# Patient Record
Sex: Female | Born: 1940 | Race: White | Hispanic: No | Smoking: Former smoker
Health system: Southern US, Community
[De-identification: ages and names within clinical notes are randomized; demographics above are authoritative.]

---

## 2004-01-28 ENCOUNTER — Ambulatory Visit (HOSPITAL_COMMUNITY): Admission: RE | Admit: 2004-01-28 | Discharge: 2004-01-28 | Payer: Self-pay | Admitting: Plastic Surgery

## 2007-03-13 ENCOUNTER — Encounter: Admission: RE | Admit: 2007-03-13 | Discharge: 2007-03-13 | Payer: Self-pay | Admitting: Internal Medicine

## 2015-04-28 DIAGNOSIS — G4700 Insomnia, unspecified: Secondary | ICD-10-CM | POA: Insufficient documentation

## 2015-04-28 DIAGNOSIS — F981 Encopresis not due to a substance or known physiological condition: Secondary | ICD-10-CM | POA: Insufficient documentation

## 2015-04-28 DIAGNOSIS — K589 Irritable bowel syndrome without diarrhea: Secondary | ICD-10-CM | POA: Insufficient documentation

## 2015-12-18 DIAGNOSIS — F3342 Major depressive disorder, recurrent, in full remission: Secondary | ICD-10-CM | POA: Insufficient documentation

## 2016-02-12 DIAGNOSIS — H524 Presbyopia: Secondary | ICD-10-CM | POA: Insufficient documentation

## 2016-02-12 DIAGNOSIS — H2510 Age-related nuclear cataract, unspecified eye: Secondary | ICD-10-CM | POA: Insufficient documentation

## 2017-03-23 DIAGNOSIS — R159 Full incontinence of feces: Secondary | ICD-10-CM | POA: Insufficient documentation

## 2017-03-23 DIAGNOSIS — R634 Abnormal weight loss: Secondary | ICD-10-CM | POA: Insufficient documentation

## 2017-03-23 DIAGNOSIS — Z860100 Personal history of colon polyps, unspecified: Secondary | ICD-10-CM | POA: Insufficient documentation

## 2017-03-23 DIAGNOSIS — K529 Noninfective gastroenteritis and colitis, unspecified: Secondary | ICD-10-CM | POA: Insufficient documentation

## 2017-03-23 DIAGNOSIS — R630 Anorexia: Secondary | ICD-10-CM | POA: Insufficient documentation

## 2017-11-07 DIAGNOSIS — H524 Presbyopia: Secondary | ICD-10-CM | POA: Insufficient documentation

## 2017-11-07 DIAGNOSIS — H52203 Unspecified astigmatism, bilateral: Secondary | ICD-10-CM

## 2017-11-07 DIAGNOSIS — H43393 Other vitreous opacities, bilateral: Secondary | ICD-10-CM | POA: Insufficient documentation

## 2017-11-07 DIAGNOSIS — H5203 Hypermetropia, bilateral: Secondary | ICD-10-CM | POA: Insufficient documentation

## 2017-11-07 DIAGNOSIS — H40003 Preglaucoma, unspecified, bilateral: Secondary | ICD-10-CM | POA: Insufficient documentation

## 2017-12-27 DIAGNOSIS — F419 Anxiety disorder, unspecified: Secondary | ICD-10-CM | POA: Insufficient documentation

## 2018-04-25 ENCOUNTER — Telehealth (INDEPENDENT_AMBULATORY_CARE_PROVIDER_SITE_OTHER): Payer: Self-pay | Admitting: Physical Medicine and Rehabilitation

## 2018-04-25 ENCOUNTER — Ambulatory Visit (INDEPENDENT_AMBULATORY_CARE_PROVIDER_SITE_OTHER): Payer: Medicare HMO | Admitting: Physical Medicine and Rehabilitation

## 2018-04-25 ENCOUNTER — Encounter (INDEPENDENT_AMBULATORY_CARE_PROVIDER_SITE_OTHER): Payer: Self-pay | Admitting: Physical Medicine and Rehabilitation

## 2018-04-25 VITALS — BP 154/89 | HR 71

## 2018-04-25 DIAGNOSIS — M5116 Intervertebral disc disorders with radiculopathy, lumbar region: Secondary | ICD-10-CM | POA: Diagnosis not present

## 2018-04-25 DIAGNOSIS — M5441 Lumbago with sciatica, right side: Secondary | ICD-10-CM | POA: Diagnosis not present

## 2018-04-25 DIAGNOSIS — G8929 Other chronic pain: Secondary | ICD-10-CM | POA: Diagnosis not present

## 2018-04-25 DIAGNOSIS — M5416 Radiculopathy, lumbar region: Secondary | ICD-10-CM

## 2018-04-25 MED ORDER — PREDNISONE 50 MG PO TABS: ORAL_TABLET | ORAL | 0 refills | Status: AC

## 2018-04-25 NOTE — Telephone Encounter (Signed)
Needs auth see below.

## 2018-04-25 NOTE — Telephone Encounter (Signed)
Dictation done

## 2018-04-25 NOTE — Progress Notes (Signed)
Jennifer MedicoHelen Schlee - 77 y.o. female MRN [id number]  Date of birth: 04/10/1941  Office Visit Note: Visit Date: 04/25/2018 PCP: Lynn ItoSmith, Lori, MD Referred by: No ref. provider found  Subjective: Chief Complaint  Patient presents with  . Lower Back - Other  . Right Hip - Pain   HPI: Jennifer Navarro is a 77 y.o. female who comes in today At the request of her primary care physician Dr. Katrinka BlazingSmith for evaluation and management of chronic worsening right hip and thigh pain.  She reports that this began in November after she had been playing golf sometime before that and felt a twinge in her right lower back.  She reports mainly buttock type pain but with some referral across the greater trochanter and into the groin but also into the thigh.  It never goes past the knee.  She gets an aching tooth aching type sensation but no real numbness or tingling.  She gets some relief with sitting and sleeping but must sleep on her back and her awkward position if she moves at all does seem to hurt.  Standing and walking are problematic.  She initially saw her primary care physician and nurse practitioner who thought that she was having symptoms of urinary tract infection or bladder infection which she did have.  This was treated successfully but the symptoms were still remaining.  Symptoms are bad enough that she did obtain intramuscular Kenalog injection with some help.  She has been using ibuprofen with some relief.  Muscle relaxer was utilized but did not help very much at all.  She does use lorazepam at night for sleep.  She does have some level of history of anxiety.  She has not noted any left-sided complaints.  She is had no focal weakness or bowel or bladder changes.  No red flag complaints otherwise.  She has had no fevers chills or night sweats or unexplained weight loss.  She has no history of prior lumbar injections or surgery.  CT scan of the lumbar spine was ultimately obtained and this is reviewed below and does show  right-sided disc herniation at L2-3.  ROS Otherwise per HPI.  Assessment & Plan: Visit Diagnoses:  1. Chronic right-sided low back pain with right-sided sciatica   2. Radiculopathy due to lumbar intervertebral disc disorder   3. Lumbar radiculopathy     Plan: Findings:  Chronic worsening low back and right hip and thigh pain and some anterior groin pain that is in all likelihood related to the L2-3 right disc extrusion with some impact to the lateral recess superiorly or cranially.  She has normal hip movement otherwise and I think this is really an issue of the lumbar spine.  It likely resulted when she did play golf and she felt twinge in her back at that point even though it was not severe at the time.  We talked about the natural history of disc herniations and extrusions.  This will likely get better on its own at some point is these disc do tend to diminish over time and regress.  In the meantime they are quite painful with nerve root pain.  Her symptoms are pretty consistent with L2 or L3 root pain.  I think the best approach is diagnostic note for therapeutic right L2-3 interlaminar epidural steroid injection.  Would consider L2 or L3 transforaminal approach.  She has no red flag symptoms or complaints or physical findings.  If she does not get much relief obviously could look at microdiscectomy  although she really wants to consider that at this point and this would be a referral to Dr. Otelia Sergeant in our office versus a neurosurgeon.  In terms of medication management she is doing quite well on ibuprofen but I think given the fact that would not be amenable to seeing her quickly for the injection I am not going to start prednisone 50 mg daily for 5 days.  She actually got some relief with prior general Kenalog injection intramuscularly.  In the future could look at nerve membrane stabilizing type medications depending on where she is at.  We talked about activity modification and exercises.    Meds  & Orders:  Meds ordered this encounter  Medications  . predniSONE (DELTASONE) 50 MG tablet    Sig: Take 1 tablet daily with food for 5 days until finished    Dispense:  5 tablet    Refill:  0   No orders of the defined types were placed in this encounter.   Follow-up: Return for Right L2-3 interlaminar epidural steroid injection.   Procedures: No procedures performed  No notes on file   Clinical History: CT ABDOMEN AND PELVIS WITHOUT CONTRAST  TECHNIQUE: Multidetector CT imaging of the abdomen and pelvis was performed following the standard protocol without IV contrast.  COMPARISON: Lumbar spine radiographs 03/21/2018.  FINDINGS: Lower chest: Clear lung bases. No significant pleural or pericardial effusion.  Hepatobiliary: As imaged in the noncontrast state, the liver appears unremarkable. No evidence of gallstones, gallbladder wall thickening or biliary dilatation.  Pancreas: Unremarkable. No pancreatic ductal dilatation or surrounding inflammatory changes.  Spleen: Normal in size without focal abnormality.  Adrenals/Urinary Tract: Both adrenal glands appear normal. There is a 17 mm cyst in the upper pole of the left kidney. Both kidneys otherwise appear normal, without evidence of urinary tract calculus or hydronephrosis. The bladder appears normal.  Stomach/Bowel: No evidence of bowel wall thickening, distention or surrounding inflammatory change. Diminutive appendix or appendiceal stump without surrounding inflammation. Moderate stool throughout the colon.  Vascular/Lymphatic: There are no enlarged abdominal or pelvic lymph nodes. Mild aortic and branch vessel atherosclerosis.  Reproductive: The uterus and ovaries appear normal. No adnexal mass.  Other: No evidence of abdominal wall mass or hernia. No ascites.  Musculoskeletal: No acute or significant osseous findings. Lower lumbar spondylosis with advanced disc space narrowing at L5-S1. S1 transitional.  There is a moderate-sized disc extrusion at L2-3 with cephalad extension in the right subarticular lateral recess.  IMPRESSION: 1. No evidence of urinary tract calculus or hydronephrosis. 2. Right-sided disc extrusion at L2-3 may contribute to right-sided back pain. 3. No acute abdominal findings. 4. Aortic Atherosclerosis (ICD10-I70.0).   Electronically Signed  By: Carey Bullocks M.D.  On: 04/05/2018 12:25   She reports that she has quit smoking. She has never used smokeless tobacco. No results for input(s): HGBA1C, LABURIC in the last 8760 hours.  Objective:  VS:  HT:    WT:   BMI:     BP:(!) 154/89  HR:71bpm  TEMP: ( )  RESP:  Physical Exam Vitals signs and nursing note reviewed.  Constitutional:      General: She is not in acute distress.    Appearance: Normal appearance. She is well-developed.  HENT:     Head: Normocephalic and atraumatic.     Nose: Nose normal.     Mouth/Throat:     Mouth: Mucous membranes are moist.     Pharynx: Oropharynx is clear.  Eyes:     Conjunctiva/sclera: Conjunctivae  normal.     Pupils: Pupils are equal, round, and reactive to light.  Neck:     Musculoskeletal: Normal range of motion and neck supple.  Cardiovascular:     Rate and Rhythm: Regular rhythm.  Pulmonary:     Effort: Pulmonary effort is normal. No respiratory distress.  Abdominal:     General: There is no distension.     Palpations: Abdomen is soft.     Tenderness: There is no guarding.  Musculoskeletal:     Right lower leg: No edema.     Left lower leg: No edema.     Comments: Patient ambulates without aid.  She has good strength in the lower extremities bilaterally with good hip flexion 5 out of 5 and symmetric bilaterally as well as knee flexion extension and dorsiflexion plantarflexion EHL.  She has no clonus bilaterally.  She has no pain with hip rotation internally or externally.  No pain over the PSIS or greater trochanters.  Skin:    General: Skin is warm and  dry.     Findings: No erythema or rash.  Neurological:     General: No focal deficit present.     Mental Status: She is alert and oriented to person, place, and time.     Motor: No abnormal muscle tone.     Coordination: Coordination normal.     Gait: Gait normal.  Psychiatric:        Mood and Affect: Mood normal.        Behavior: Behavior normal.        Thought Content: Thought content normal.     Ortho Exam Imaging: No results found.  Past Medical/Family/Surgical/Social History: Medications & Allergies reviewed per EMR, new medications updated. Patient Active Problem List   Diagnosis Date Noted  . Anxiety 12/27/2017  . Glaucoma suspect of both eyes 11/07/2017  . Hyperopia with astigmatism and presbyopia, bilateral 11/07/2017  . Vitreous floater, bilateral 11/07/2017  . Chronic diarrhea 03/23/2017  . History of colon polyps 03/23/2017  . Incontinence of feces 03/23/2017  . Loss of appetite 03/23/2017  . Weight loss 03/23/2017  . Presbyopia 02/12/2016  . 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 Recurrent major depressive disorder, in full remission (HCC) 12/18/2015  . 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 IBS (irritable bowel syndrome) 04/28/2015  . Insomnia 04/28/2015   History reviewed. No pertinent past medical history. History reviewed. No pertinent family history. History reviewed. No pertinent surgical history. Social History   Occupational History  . Not on file  Tobacco Use  . Smoking status: Former Games developer  . Smokeless tobacco: Never Used  Substance and Sexual Activity  . Alcohol use: Not on file  . Drug use: Not on file  . Sexual activity: Not on file

## 2018-04-25 NOTE — Progress Notes (Signed)
  Numeric Pain Rating Scale and Functional Assessment Average Pain 8 Pain Right Now 2 My pain is intermittent and sharp Pain is worse with: walking and standing Pain improves with: Sitting   In the last MONTH (on 0-10 scale) has pain interfered with the following?  1. General activity like being  able to carry out your everyday physical activities such as walking, climbing stairs, carrying groceries, or moving a chair?  Rating(10)  2. Relation with others like being able to carry out your usual social activities and roles such as  activities at home, at work and in your community. Rating(10)  3. Enjoyment of life such that you have  been bothered by emotional problems such as feeling anxious, depressed or irritable?  Rating(10)

## 2018-04-26 NOTE — Telephone Encounter (Signed)
Service [phone]:[id number] Auth Effective Date:12/18/2019Auth End Date:03/17/2020Initiated Date:12/18/2019Decision Date:12/18/2019Decision Type :InitialCase Status:Approved

## 2018-05-16 ENCOUNTER — Ambulatory Visit (INDEPENDENT_AMBULATORY_CARE_PROVIDER_SITE_OTHER): Payer: Self-pay

## 2018-05-16 ENCOUNTER — Encounter (INDEPENDENT_AMBULATORY_CARE_PROVIDER_SITE_OTHER): Payer: Self-pay | Admitting: Physical Medicine and Rehabilitation

## 2018-05-16 ENCOUNTER — Ambulatory Visit (INDEPENDENT_AMBULATORY_CARE_PROVIDER_SITE_OTHER): Payer: Medicare HMO | Admitting: Physical Medicine and Rehabilitation

## 2018-05-16 VITALS — BP 162/83 | HR 69 | Temp 97.9°F

## 2018-05-16 DIAGNOSIS — M5416 Radiculopathy, lumbar region: Secondary | ICD-10-CM | POA: Diagnosis not present

## 2018-05-16 MED ORDER — METHYLPREDNISOLONE ACETATE 80 MG/ML IJ SUSP
80.0000 mg | Freq: Once | INTRAMUSCULAR | Status: AC
  Administered 2018-05-16: 80 mg

## 2018-05-16 NOTE — Procedures (Signed)
Lumbar Epidural Steroid Injection - Interlaminar Approach with Fluoroscopic Guidance  Patient: Jennifer Navarro      Date of Birth: [birth year] MRN: [id number] PCP: Lynn Ito, MD      Visit Date: 05/16/2018   Universal Protocol:     Consent Given By: the patient  Position: PRONE  Additional Comments: Vital signs were monitored before and after the procedure. Patient was prepped and draped in the usual sterile fashion. The correct patient, procedure, and site was verified.   Injection Procedure Details:  Procedure Site One Meds Administered:  Meds ordered this encounter  Medications  . methylPREDNISolone acetate (DEPO-MEDROL) injection 80 mg     Laterality: Right  Location/Site:  L2-L3  Needle size: 20 G  Needle type: Tuohy  Needle Placement: Paramedian epidural  Findings:   -Comments: Excellent flow of contrast into the epidural space.  Procedure Details: Using a paramedian approach from the side mentioned above, the region overlying the inferior lamina was localized under fluoroscopic visualization and the soft tissues overlying this structure were infiltrated with 4 ml. of 1% Lidocaine without Epinephrine. The Tuohy needle was inserted into the epidural space using a paramedian approach.   The epidural space was localized using loss of resistance along with lateral and bi-planar fluoroscopic views.  After negative aspirate for air, blood, and CSF, a 2 ml. volume of Isovue-250 was injected into the epidural space and the flow of contrast was observed. Radiographs were obtained for documentation purposes.    The injectate was administered into the level noted above.   Additional Comments:  The patient tolerated the procedure well Dressing: Band-Aid    Post-procedure details: Patient was observed during the procedure. Post-procedure instructions were reviewed.  Patient left the clinic in stable condition.

## 2018-05-16 NOTE — Progress Notes (Signed)
.  Numeric Pain Rating Scale and Functional Assessment Average Pain 10   In the last MONTH (on 0-10 scale) has pain interfered with the following?  1. General activity like being  able to carry out your everyday physical activities such as walking, climbing stairs, carrying groceries, or moving a chair?  Rating(5)   +Driver, -BT, -Dye Allergies.  

## 2018-05-16 NOTE — Patient Instructions (Signed)

## 2018-05-18 ENCOUNTER — Telehealth (INDEPENDENT_AMBULATORY_CARE_PROVIDER_SITE_OTHER): Payer: Self-pay | Admitting: *Deleted

## 2018-05-18 NOTE — Telephone Encounter (Signed)
Thank you for submitting your preauthorization request. The Case has been Approved for [id number].  Service [phone]:[id number] Auth Effective Date:01/08/2020Auth End Date:07/06/2020Initiated Date:01/08/2020Decision Date:01/09/2020Decision Type :InitialCase Status:Approved

## 2018-05-23 NOTE — Progress Notes (Signed)
Jennifer MedicoHelen Navarro - 78 y.o. female MRN [id number]  Date of birth: 05/06/1941  Office Visit Note: Visit Date: 05/16/2018 PCP: Lynn ItoSmith, Lori, MD Referred by: No ref. provider found  Subjective: Chief Complaint  Patient presents with  . Lower Back - Pain  . Right Hip - Pain  . Right Leg - Pain   HPI:  Jennifer MedicoHelen Navarro is a 78 y.o. female who comes in today For planned right L2-3 interlaminar epidural steroid injection.  Please see our prior evaluation and management note for further details and justification.  ROS Otherwise per HPI.  Assessment & Plan: Visit Diagnoses:  1. Lumbar radiculopathy     Plan: No additional findings.   Meds & Orders:  Meds ordered this encounter  Medications  . methylPREDNISolone acetate (DEPO-MEDROL) injection 80 mg    Orders Placed This Encounter  Procedures  . XR C-ARM NO REPORT  . Epidural Steroid injection    Follow-up: Return if symptoms worsen or fail to improve.   Procedures: No procedures performed  Lumbar Epidural Steroid Injection - Interlaminar Approach with Fluoroscopic Guidance  Patient: Jennifer MedicoHelen Navarro      Date of Birth: 04/05/1941 MRN: [id number] PCP: Lynn ItoSmith, Lori, MD      Visit Date: 05/16/2018   Universal Protocol:     Consent Given By: the patient  Position: PRONE  Additional Comments: Vital signs were monitored before and after the procedure. Patient was prepped and draped in the usual sterile fashion. The correct patient, procedure, and site was verified.   Injection Procedure Details:  Procedure Site One Meds Administered:  Meds ordered this encounter  Medications  . methylPREDNISolone acetate (DEPO-MEDROL) injection 80 mg     Laterality: Right  Location/Site:  L2-L3  Needle size: 20 G  Needle type: Tuohy  Needle Placement: Paramedian epidural  Findings:   -Comments: Excellent flow of contrast into the epidural space.  Procedure Details: Using a paramedian approach from the side mentioned above, the region  overlying the inferior lamina was localized under fluoroscopic visualization and the soft tissues overlying this structure were infiltrated with 4 ml. of 1% Lidocaine without Epinephrine. The Tuohy needle was inserted into the epidural space using a paramedian approach.   The epidural space was localized using loss of resistance along with lateral and bi-planar fluoroscopic views.  After negative aspirate for air, blood, and CSF, a 2 ml. volume of Isovue-250 was injected into the epidural space and the flow of contrast was observed. Radiographs were obtained for documentation purposes.    The injectate was administered into the level noted above.   Additional Comments:  The patient tolerated the procedure well Dressing: Band-Aid    Post-procedure details: Patient was observed during the procedure. Post-procedure instructions were reviewed.  Patient left the clinic in stable condition.   Clinical History: CT ABDOMEN AND PELVIS WITHOUT CONTRAST  TECHNIQUE: Multidetector CT imaging of the abdomen and pelvis was performed following the standard protocol without IV contrast.  COMPARISON: Lumbar spine radiographs 03/21/2018.  FINDINGS: Lower chest: Clear lung bases. No significant pleural or pericardial effusion.  Hepatobiliary: As imaged in the noncontrast state, the liver appears unremarkable. No evidence of gallstones, gallbladder wall thickening or biliary dilatation.  Pancreas: Unremarkable. No pancreatic ductal dilatation or surrounding inflammatory changes.  Spleen: Normal in size without focal abnormality.  Adrenals/Urinary Tract: Both adrenal glands appear normal. There is a 17 mm cyst in the upper pole of the left kidney. Both kidneys otherwise appear normal, without evidence of urinary tract calculus or hydronephrosis.  The bladder appears normal.  Stomach/Bowel: No evidence of bowel wall thickening, distention or surrounding inflammatory change. Diminutive appendix  or appendiceal stump without surrounding inflammation. Moderate stool throughout the colon.  Vascular/Lymphatic: There are no enlarged abdominal or pelvic lymph nodes. Mild aortic and branch vessel atherosclerosis.  Reproductive: The uterus and ovaries appear normal. No adnexal mass.  Other: No evidence of abdominal wall mass or hernia. No ascites.  Musculoskeletal: No acute or significant osseous findings. Lower lumbar spondylosis with advanced disc space narrowing at L5-S1. S1 transitional. There is a moderate-sized disc extrusion at L2-3 with cephalad extension in the right subarticular lateral recess.  IMPRESSION: 1. No evidence of urinary tract calculus or hydronephrosis. 2. Right-sided disc extrusion at L2-3 may contribute to right-sided back pain. 3. No acute abdominal findings. 4. Aortic Atherosclerosis (ICD10-I70.0).   Electronically Signed  By: Carey Bullocks M.D.  On: 04/05/2018 12:25     Objective:  VS:  HT:    WT:   BMI:     BP:(!) 162/83  HR:69bpm  TEMP:97.9 F (36.6 C)(Oral)  RESP:  Physical Exam  Ortho Exam Imaging: No results found.

## 2018-06-12 ENCOUNTER — Ambulatory Visit (INDEPENDENT_AMBULATORY_CARE_PROVIDER_SITE_OTHER): Payer: Self-pay

## 2018-06-12 ENCOUNTER — Ambulatory Visit (INDEPENDENT_AMBULATORY_CARE_PROVIDER_SITE_OTHER): Payer: Medicare HMO | Admitting: Physical Medicine and Rehabilitation

## 2018-06-12 ENCOUNTER — Encounter (INDEPENDENT_AMBULATORY_CARE_PROVIDER_SITE_OTHER): Payer: Self-pay | Admitting: Physical Medicine and Rehabilitation

## 2018-06-12 VITALS — BP 143/76 | HR 75 | Temp 98.0°F

## 2018-06-12 DIAGNOSIS — M5116 Intervertebral disc disorders with radiculopathy, lumbar region: Secondary | ICD-10-CM

## 2018-06-12 DIAGNOSIS — M5416 Radiculopathy, lumbar region: Secondary | ICD-10-CM

## 2018-06-12 MED ORDER — METHYLPREDNISOLONE ACETATE 80 MG/ML IJ SUSP
80.0000 mg | Freq: Once | INTRAMUSCULAR | Status: AC
  Administered 2018-06-12: 80 mg

## 2018-06-12 NOTE — Progress Notes (Signed)
 .  Numeric Pain Rating Scale and Functional Assessment Average Pain 7   In the last MONTH (on 0-10 scale) has pain interfered with the following?  1. General activity like being  able to carry out your everyday physical activities such as walking, climbing stairs, carrying groceries, or moving a chair?  Rating(5)   +Driver, -BT, -Dye Allergies.  

## 2018-06-13 NOTE — Procedures (Signed)
Lumbar Epidural Steroid Injection - Interlaminar Approach with Fluoroscopic Guidance  Patient: Jennifer Navarro      Date of Birth: [birth year] MRN: [id number] PCP: Lynn Ito, MD      Visit Date: 06/12/2018   Universal Protocol:     Consent Given By: the patient  Position: PRONE  Additional Comments: Vital signs were monitored before and after the procedure. Patient was prepped and draped in the usual sterile fashion. The correct patient, procedure, and site was verified.   Injection Procedure Details:  Procedure Site One Meds Administered:  Meds ordered this encounter  Medications  . methylPREDNISolone acetate (DEPO-MEDROL) injection 80 mg     Laterality: Right  Location/Site:  L3-L4  Needle size: 20 G  Needle type: Tuohy  Needle Placement: Paramedian epidural  Findings:   -Comments: Excellent flow of contrast along the nerve and into the epidural space.  Procedure Details: Using a paramedian approach from the side mentioned above, the region overlying the inferior lamina was localized under fluoroscopic visualization and the soft tissues overlying this structure were infiltrated with 4 ml. of 1% Lidocaine without Epinephrine. The Tuohy needle was inserted into the epidural space using a paramedian approach.   The epidural space was localized using loss of resistance along with lateral and bi-planar fluoroscopic views.  After negative aspirate for air, blood, and CSF, a 2 ml. volume of Isovue-250 was injected into the epidural space and the flow of contrast was observed. Radiographs were obtained for documentation purposes.    The injectate was administered into the level noted above.   Additional Comments:  The patient tolerated the procedure well Dressing: Band-Aid    Post-procedure details: Patient was observed during the procedure. Post-procedure instructions were reviewed.  Patient left the clinic in stable condition.

## 2018-06-13 NOTE — Progress Notes (Signed)
Jennifer Navarro - 78 y.o. female MRN [id number]  Date of birth: [birth year]  Office Visit Note: Visit Date: 06/12/2018 PCP: Lynn Ito, MD Referred by: Lynn Ito, MD  Subjective: No chief complaint on file.  HPI:  Jennifer Navarro is a 78 y.o. female who comes in today For possible repeat epidural injection.  Patient has chronic worsening severe right hip and thigh pain consistent with more of an L2 or L3 radicular pattern.  She has no pain with hip rotation does get pain in the anterior thigh somewhat in the groin.  CT scan of the abdomen and pelvis revealed significant disc herniation at L2-3 on the right which does fit with her symptoms.  Patient has not had MRI of the lumbar spine.  In the early part of January we completed L2-3 interlaminar injection that she reports did seem to help greatly for a few days and the symptoms returned.  She does feel like there was a notable decrease in pain and she was happy for a few days and then it returned.  I think the next step is a right L3-4 interlaminar injection just to see if getting it in place just a little bit below the area where the disc herniation is to maybe look more at the L3 nerve would be the right choice.  Alternatively could look at transforaminal approach.  Had a long discussion with her today about microdiscectomy as well and she does want to seek out some information about this.  She is a very active 78 year old female that wants to get back to playing golf and tennis.  We will send a referral to Dr. Marikay Alar at [facility] neurosurgery at her request.  Depending on outcome of injection today would also consider MRI of the lumbar spine to further evaluate the disc herniation.  ROS Otherwise per HPI.  Assessment & Plan: Visit Diagnoses:  1. Lumbar radiculopathy   2. Radiculopathy due to lumbar intervertebral disc disorder     Plan: No additional findings.   Meds & Orders:  Meds ordered this encounter  Medications  . methylPREDNISolone  acetate (DEPO-MEDROL) injection 80 mg    Orders Placed This Encounter  Procedures  . XR C-ARM NO REPORT  . Ambulatory referral to Neurosurgery  . Epidural Steroid injection    Follow-up: Return if symptoms worsen or fail to improve, for ? Lumbar MRI.   Procedures: No procedures performed  Lumbar Epidural Steroid Injection - Interlaminar Approach with Fluoroscopic Guidance  Patient: Jennifer Navarro      Date of Birth: 11/16/1940 MRN: [id number] PCP: Lynn Ito, MD      Visit Date: 06/12/2018   Universal Protocol:     Consent Given By: the patient  Position: PRONE  Additional Comments: Vital signs were monitored before and after the procedure. Patient was prepped and draped in the usual sterile fashion. The correct patient, procedure, and site was verified.   Injection Procedure Details:  Procedure Site One Meds Administered:  Meds ordered this encounter  Medications  . methylPREDNISolone acetate (DEPO-MEDROL) injection 80 mg     Laterality: Right  Location/Site:  L3-L4  Needle size: 20 G  Needle type: Tuohy  Needle Placement: Paramedian epidural  Findings:   -Comments: Excellent flow of contrast along the nerve and into the epidural space.  Procedure Details: Using a paramedian approach from the side mentioned above, the region overlying the inferior lamina was localized under fluoroscopic visualization and the soft tissues overlying this structure were infiltrated with 4 ml. of  1% Lidocaine without Epinephrine. The Tuohy needle was inserted into the epidural space using a paramedian approach.   The epidural space was localized using loss of resistance along with lateral and bi-planar fluoroscopic views.  After negative aspirate for air, blood, and CSF, a 2 ml. volume of Isovue-250 was injected into the epidural space and the flow of contrast was observed. Radiographs were obtained for documentation purposes.    The injectate was administered into the level noted  above.   Additional Comments:  The patient tolerated the procedure well Dressing: Band-Aid    Post-procedure details: Patient was observed during the procedure. Post-procedure instructions were reviewed.  Patient left the clinic in stable condition.   Clinical History: CT ABDOMEN AND PELVIS WITHOUT CONTRAST  TECHNIQUE: Multidetector CT imaging of the abdomen and pelvis was performed following the standard protocol without IV contrast.  COMPARISON: Lumbar spine radiographs 03/21/2018.  FINDINGS: Lower chest: Clear lung bases. No significant pleural or pericardial effusion.  Hepatobiliary: As imaged in the noncontrast state, the liver appears unremarkable. No evidence of gallstones, gallbladder wall thickening or biliary dilatation.  Pancreas: Unremarkable. No pancreatic ductal dilatation or surrounding inflammatory changes.  Spleen: Normal in size without focal abnormality.  Adrenals/Urinary Tract: Both adrenal glands appear normal. There is a 17 mm cyst in the upper pole of the left kidney. Both kidneys otherwise appear normal, without evidence of urinary tract calculus or hydronephrosis. The bladder appears normal.  Stomach/Bowel: No evidence of bowel wall thickening, distention or surrounding inflammatory change. Diminutive appendix or appendiceal stump without surrounding inflammation. Moderate stool throughout the colon.  Vascular/Lymphatic: There are no enlarged abdominal or pelvic lymph nodes. Mild aortic and branch vessel atherosclerosis.  Reproductive: The uterus and ovaries appear normal. No adnexal mass.  Other: No evidence of abdominal wall mass or hernia. No ascites.  Musculoskeletal: No acute or significant osseous findings. Lower lumbar spondylosis with advanced disc space narrowing at L5-S1. S1 transitional. There is a moderate-sized disc extrusion at L2-3 with cephalad extension in the right subarticular lateral recess.  IMPRESSION: 1. No  evidence of urinary tract calculus or hydronephrosis. 2. Right-sided disc extrusion at L2-3 may contribute to right-sided back pain. 3. No acute abdominal findings. 4. Aortic Atherosclerosis (ICD10-I70.0).   Electronically Signed  By: Carey Bullocks M.D.  On: 04/05/2018 12:25     Objective:  VS:  HT:    WT:   BMI:     BP:(!) 143/76  HR:75bpm  TEMP:98 F (36.7 C)(Oral)  RESP:  Physical Exam Musculoskeletal:     Right lower leg: No edema.     Left lower leg: No edema.     Comments: Patient ambulates without aid.  She has no pain over the greater trochanters and good distal strength.  She has no pain with hip rotation.  Neurological:     General: No focal deficit present.     Mental Status: She is alert and oriented to person, place, and time.     Motor: No abnormal muscle tone.     Coordination: Coordination normal.     Gait: Gait abnormal.  Psychiatric:        Mood and Affect: Mood normal.        Behavior: Behavior normal.     Ortho Exam Imaging: Xr C-arm No Report  Result Date: 06/12/2018 Please see Notes tab for imaging impression.

## 2018-07-24 ENCOUNTER — Other Ambulatory Visit: Payer: Self-pay | Admitting: Neurological Surgery

## 2018-07-24 DIAGNOSIS — M5126 Other intervertebral disc displacement, lumbar region: Secondary | ICD-10-CM

## 2018-07-25 ENCOUNTER — Other Ambulatory Visit: Payer: Self-pay

## 2018-07-25 ENCOUNTER — Ambulatory Visit
Admission: RE | Admit: 2018-07-25 | Discharge: 2018-07-25 | Disposition: A | Discharge status: Home / Self Care | Payer: Medicare HMO | Source: Ambulatory Visit | Attending: Neurological Surgery | Admitting: Neurological Surgery

## 2018-07-25 DIAGNOSIS — M5126 Other intervertebral disc displacement, lumbar region: Secondary | ICD-10-CM

## 2019-04-24 ENCOUNTER — Other Ambulatory Visit: Payer: Self-pay

## 2019-04-24 ENCOUNTER — Encounter: Payer: Self-pay | Admitting: Orthopaedic Surgery

## 2019-04-24 ENCOUNTER — Ambulatory Visit: Payer: Medicare HMO | Admitting: Orthopaedic Surgery

## 2019-04-24 DIAGNOSIS — G5602 Carpal tunnel syndrome, left upper limb: Secondary | ICD-10-CM | POA: Diagnosis not present

## 2019-04-24 DIAGNOSIS — M79642 Pain in left hand: Secondary | ICD-10-CM

## 2019-04-24 NOTE — Progress Notes (Signed)
Office Visit Note   Patient: Jennifer Navarro           Date of Birth: 02/17/1941           MRN: [id number] Visit Date: 04/24/2019              Requested by: Lynn Ito, MD [address] [place],  [place] [address] PCP: Lynn Ito, MD   Assessment & Plan: Visit Diagnoses:  1. Left carpal tunnel syndrome     Plan: My impression is left carpal tunnel syndrome.  At this point we will need nerve conduction studies to assess the severity.  I will call the patient with the results when available.  She will continue to use the carpal tunnel brace as needed.  Follow-Up Instructions: Return if symptoms worsen or fail to improve.   Orders:  No orders of the defined types were placed in this encounter.  No orders of the defined types were placed in this encounter.     Procedures: No procedures performed   Clinical Data: No additional findings.   Subjective: Chief Complaint  Patient presents with  . Left Hand - Pain    Jennifer Navarro is a 78 year old female who is referral from Dr. Alvester Morin for suspected left carpal tunnel syndrome.  It started after mechanical fall onto her left wrist on September 23.  She saw Dr. Thamas Jaegers in the [facility] who gave her an injection which did not give her any relief.  She has not had any nerve conduction studies.  She wears a carpal tunnel brace all day which does help.  She does not drop things from her hand.  She is right-hand dominant.  She takes ibuprofen as needed.  She is also using Epson salt rub.   Review of Systems  Constitutional: Negative.   HENT: Negative.   Eyes: Negative.   Respiratory: Negative.   Cardiovascular: Negative.   Endocrine: Negative.   Musculoskeletal: Negative.   Neurological: Negative.   Hematological: Negative.   Psychiatric/Behavioral: Negative.   All other systems reviewed and are negative.    Objective: Vital Signs: There were no vitals taken for this visit.  Physical Exam Vitals and nursing note  reviewed.  Constitutional:      Appearance: She is well-developed.  HENT:     Head: Normocephalic and atraumatic.  Pulmonary:     Effort: Pulmonary effort is normal.  Abdominal:     Palpations: Abdomen is soft.  Musculoskeletal:     Cervical back: Neck supple.  Skin:    General: Skin is warm.     Capillary Refill: Capillary refill takes less than 2 seconds.  Neurological:     Mental Status: She is alert and oriented to person, place, and time.  Psychiatric:        Behavior: Behavior normal.        Thought Content: Thought content normal.        Judgment: Judgment normal.     Ortho Exam Left hand exam shows moderate thenar flattening.  She has subjective decreased sensation in the median nerve distribution.  No motor deficits.  Specialty Comments:  No specialty comments available.  Imaging: No results found.   PMFS History: Patient Active Problem List   Diagnosis Date Noted  . Left carpal tunnel syndrome 04/24/2019  . Anxiety 12/27/2017  . Glaucoma suspect of both eyes 11/07/2017  . Hyperopia with astigmatism and presbyopia, bilateral 11/07/2017  . Vitreous floater, bilateral 11/07/2017  . Chronic diarrhea 03/23/2017  .  History of colon polyps 03/23/2017  . Incontinence of feces 03/23/2017  . Loss of appetite 03/23/2017  . Weight loss 03/23/2017  . Presbyopia 02/12/2016  . 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 Recurrent major depressive disorder, in full remission ([place]) 12/18/2015  . 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 IBS (irritable bowel syndrome) 04/28/2015  . Insomnia 04/28/2015   History reviewed. No pertinent past medical history.  History reviewed. No pertinent family history.  History reviewed. No pertinent surgical history. Social History   Occupational History  . Not on file  Tobacco Use  . Smoking status: Former Research scientist (life sciences)  . Smokeless tobacco: Never Used  Substance and Sexual Activity  . Alcohol use:  Not on file  . Drug use: Not on file  . Sexual activity: Not on file

## 2019-05-25 ENCOUNTER — Encounter: Payer: Self-pay | Admitting: Physical Medicine and Rehabilitation

## 2019-05-25 ENCOUNTER — Other Ambulatory Visit: Payer: Self-pay

## 2019-05-25 ENCOUNTER — Ambulatory Visit (INDEPENDENT_AMBULATORY_CARE_PROVIDER_SITE_OTHER): Payer: Medicare HMO | Admitting: Physical Medicine and Rehabilitation

## 2019-05-25 ENCOUNTER — Telehealth: Payer: Self-pay | Admitting: Orthopaedic Surgery

## 2019-05-25 DIAGNOSIS — R202 Paresthesia of skin: Secondary | ICD-10-CM | POA: Diagnosis not present

## 2019-05-25 NOTE — Telephone Encounter (Signed)
Please advise. Thanks.  

## 2019-05-25 NOTE — Progress Notes (Signed)
 .  Numeric Pain Rating Scale and Functional Assessment Average Pain 5   In the last MONTH (on 0-10 scale) has pain interfered with the following?  1. General activity like being  able to carry out your everyday physical activities such as walking, climbing stairs, carrying groceries, or moving a chair?  Rating(5)     

## 2019-05-25 NOTE — Progress Notes (Signed)
Called patient no answer LMOM to return call and make a f/u appt with Dr. Roda Shutters.

## 2019-05-25 NOTE — Telephone Encounter (Signed)
Patient called asked if Dr Roda Shutters would call her to discuss the NCS she had with Dr. Alvester Morin. The number to contact patient is [phone]

## 2019-05-28 NOTE — Telephone Encounter (Signed)
Spoke with patient.  She would like to proceed with schedule left CTR.  Please book for SCG or cone day.  Thanks.

## 2019-05-28 NOTE — Progress Notes (Signed)
Called patient to make appt. She states that she prefers to get  a phone call with her results. She would like to CB in the morning time.    CB: [phone]

## 2019-05-28 NOTE — Telephone Encounter (Signed)
Want me to call and discuss results as well as advise surgery?

## 2019-05-28 NOTE — Procedures (Signed)
EMG & NCV Findings: Evaluation of the left median motor nerve showed prolonged distal onset latency (4.8 ms) and decreased conduction velocity (Elbow-Wrist, 44 m/s).  The left median (across palm) sensory nerve showed prolonged distal peak latency (Wrist, 5.7 ms) and prolonged distal peak latency (Palm, 2.3 ms).  All remaining nerves (as indicated in the following tables) were within normal limits.    All examined muscles (as indicated in the following table) showed no evidence of electrical instability.    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   There is no significant electrodiagnostic evidence of any other focal nerve entrapment, brachial plexopathy or cervical radiculopathy.   Recommendations: 1.  Follow-up with referring physician. 2.  Continue current management of symptoms. 3.  Suggest surgical evaluation.  ___________________________ Naaman Plummer FAAPMR Board Certified, American Board of Physical Medicine and Rehabilitation    Nerve Conduction Studies Anti Sensory Summary Table   Stim Site NR Peak (ms) Norm Peak (ms) P-T Amp (V) Norm P-T Amp Site1 Site2 Delta-P (ms) Dist (cm) Vel (m/s) Norm Vel (m/s)  Left Median Acr Palm Anti Sensory (2nd Digit)  30.9C  Wrist    *5.7 <3.6 12.1 >10 Wrist Palm 3.4 0.0    Palm    *2.3 <2.0 16.3         Left Radial Anti Sensory (Base 1st Digit)  29.9C  Wrist    2.6 <3.1 17.7  Wrist Base 1st Digit 2.6 0.0    Left Ulnar Anti Sensory (5th Digit)  30.7C  Wrist    3.7 <3.7 28.6 >15.0 Wrist 5th Digit 3.7 14.0 38 >38   Motor Summary Table   Stim Site NR Onset (ms) Norm Onset (ms) O-P Amp (mV) Norm O-P Amp Site1 Site2 Delta-0 (ms) Dist (cm) Vel (m/s) Norm Vel (m/s)  Left Median Motor (Abd Poll Brev)  30C  Wrist    *4.8 <4.2 5.9 >5 Elbow Wrist 4.0 17.5 *44 >50  Elbow    8.8   5.9         Left Ulnar Motor (Abd Dig Min)  30C  Wrist    3.2 <4.2 8.6 >3 B Elbow Wrist 3.1 17.0 55 >53  B Elbow    6.3  8.1  A Elbow B Elbow 1.0 9.0 90 >53  A Elbow    7.3  8.0          EMG   Side Muscle Nerve Root Ins Act Fibs Psw Amp Dur Poly Recrt Int Dennie Bible Comment  Left Abd Poll Brev Median C8-T1 Nml Nml Nml Nml Nml 0 Nml Nml   Left 1stDorInt Ulnar C8-T1 Nml Nml Nml Nml Nml 0 Nml Nml   Left PronatorTeres Median C6-7 Nml Nml Nml Nml Nml 0 Nml Nml   Left Biceps Musculocut C5-6 Nml Nml Nml Nml Nml 0 Nml Nml   Left Deltoid Axillary C5-6 Nml Nml Nml Nml Nml 0 Nml Nml     Nerve Conduction Studies Anti Sensory Left/Right Comparison   Stim Site L Lat (ms) R Lat (ms) L-R Lat (ms) L Amp (V) R Amp (V) L-R Amp (%) Site1 Site2 L Vel (m/s) R Vel (m/s) L-R Vel (m/s)  Median Acr Palm Anti Sensory (2nd Digit)  30.9C  Wrist *5.7   12.1   Wrist Palm     Palm *2.3   16.3  Radial Anti Sensory (Base 1st Digit)  29.9C  Wrist 2.6   17.7   Wrist Base 1st Digit     Ulnar Anti Sensory (5th Digit)  30.7C  Wrist 3.7   28.6   Wrist 5th Digit 38     Motor Left/Right Comparison   Stim Site L Lat (ms) R Lat (ms) L-R Lat (ms) L Amp (mV) R Amp (mV) L-R Amp (%) Site1 Site2 L Vel (m/s) R Vel (m/s) L-R Vel (m/s)  Median Motor (Abd Poll Brev)  30C  Wrist *4.8   5.9   Elbow Wrist *44    Elbow 8.8   5.9         Ulnar Motor (Abd Dig Min)  30C  Wrist 3.2   8.6   B Elbow Wrist 55    B Elbow 6.3   8.1   A Elbow B Elbow 90    A Elbow 7.3   8.0            Waveforms:

## 2019-05-28 NOTE — Progress Notes (Signed)
Jennifer Navarro - 79 y.o. female MRN [id number]  Date of birth: 04/18/1941  Office Visit Note: Visit Date: 05/25/2019 PCP: Lynn Ito, MD Referred by: Lynn Ito, MD  Subjective: Chief Complaint  Patient presents with  . Left Wrist - Pain  . Left Hand - Numbness, Pain   HPI: Jennifer Navarro is a 79 y.o. female who comes in today At the request of Dr. Glee Arvin for electrodiagnostic study of the left upper limb.  Patient is right-hand dominant with history of chronic worsening left wrist pain and numbness and tingling in the radial 3 digits.  She is right-hand dominant.  She denies any radicular complaints.  She reports symptom onset after a fall on September 23.  She actually saw an orthopedic doctor in [facility] a Dr. Thamas Jaegers.  It sounds like he felt like it was carpal tunnel syndrome and did give her an injection in the wrist.  Hard to know if this was actually an injection near the joint or in the tunnel.  She reports no relief from that injection.  She has had no prior electrodiagnostic studies.  ROS Otherwise per HPI.  Assessment & Plan: Visit Diagnoses:  1. 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   There is no significant electrodiagnostic evidence of any other focal nerve entrapment, brachial plexopathy or cervical radiculopathy.   Recommendations: 1.  Follow-up with referring physician. 2.  Continue current management of symptoms. 3.  Suggest surgical evaluation.  Meds & Orders: No orders of the defined types were placed in this encounter.   Orders Placed This Encounter  Procedures  . NCV with EMG (electromyography)    Follow-up: Return if symptoms worsen or fail to improve, for Glee Arvin, MD. patient states that she discussed with Dr. Roda Shutters about calling her with the results and  she does live in [facility].  Procedures: No procedures performed  EMG & NCV Findings: Evaluation of the left median motor nerve showed prolonged distal onset latency (4.8 ms) and decreased conduction velocity (Elbow-Wrist, 44 m/s).  The left median (across palm) sensory nerve showed prolonged distal peak latency (Wrist, 5.7 ms) and prolonged distal peak latency (Palm, 2.3 ms).  All remaining nerves (as indicated in the following tables) were within normal limits.    All examined muscles (as indicated in the following table) showed no evidence of electrical instability.    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   There is no significant electrodiagnostic evidence of any other focal nerve entrapment, brachial plexopathy or cervical radiculopathy.   Recommendations: 1.  Follow-up with referring physician. 2.  Continue current management of symptoms. 3.  Suggest surgical evaluation.  ___________________________ Naaman Plummer FAAPMR Board Certified, American Board of Physical Medicine and Rehabilitation    Nerve Conduction Studies Anti Sensory Summary Table   Stim Site NR Peak (ms) Norm Peak (ms) P-T Amp (V) Norm P-T Amp Site1 Site2 Delta-P (ms) Dist (cm) Vel (m/s) Norm Vel (m/s)  Left Median Acr Palm Anti Sensory (2nd Digit)  30.9C  Wrist    *5.7 <3.6 12.1 >10 Wrist Palm 3.4 0.0    Palm    *2.3 <2.0 16.3  Left Radial Anti Sensory (Base 1st Digit)  29.9C  Wrist    2.6 <3.1 17.7  Wrist Base 1st Digit 2.6 0.0    Left Ulnar Anti Sensory (5th Digit)  30.7C  Wrist    3.7 <3.7 28.6 >15.0 Wrist 5th Digit 3.7 14.0 38 >38   Motor Summary Table   Stim Site NR Onset (ms) Norm Onset (ms) O-P Amp (mV) Norm O-P Amp Site1 Site2 Delta-0 (ms) Dist (cm) Vel (m/s) Norm Vel (m/s)  Left Median Motor (Abd Poll Brev)  30C    Wrist    *4.8 <4.2 5.9 >5 Elbow Wrist 4.0 17.5 *44 >50  Elbow    8.8  5.9         Left Ulnar Motor (Abd Dig Min)  30C  Wrist    3.2 <4.2 8.6 >3 B Elbow Wrist 3.1 17.0 55 >53  B Elbow    6.3  8.1  A Elbow B Elbow 1.0 9.0 90 >53  A Elbow    7.3  8.0          EMG   Side Muscle Nerve Root Ins Act Fibs Psw Amp Dur Poly Recrt Int Fraser Din Comment  Left Abd Poll Brev Median C8-T1 Nml Nml Nml Nml Nml 0 Nml Nml   Left 1stDorInt Ulnar C8-T1 Nml Nml Nml Nml Nml 0 Nml Nml   Left PronatorTeres Median C6-7 Nml Nml Nml Nml Nml 0 Nml Nml   Left Biceps Musculocut C5-6 Nml Nml Nml Nml Nml 0 Nml Nml   Left Deltoid Axillary C5-6 Nml Nml Nml Nml Nml 0 Nml Nml     Nerve Conduction Studies Anti Sensory Left/Right Comparison   Stim Site L Lat (ms) R Lat (ms) L-R Lat (ms) L Amp (V) R Amp (V) L-R Amp (%) Site1 Site2 L Vel (m/s) R Vel (m/s) L-R Vel (m/s)  Median Acr Palm Anti Sensory (2nd Digit)  30.9C  Wrist *5.7   12.1   Wrist Palm     Palm *2.3   16.3         Radial Anti Sensory (Base 1st Digit)  29.9C  Wrist 2.6   17.7   Wrist Base 1st Digit     Ulnar Anti Sensory (5th Digit)  30.7C  Wrist 3.7   28.6   Wrist 5th Digit 38     Motor Left/Right Comparison   Stim Site L Lat (ms) R Lat (ms) L-R Lat (ms) L Amp (mV) R Amp (mV) L-R Amp (%) Site1 Site2 L Vel (m/s) R Vel (m/s) L-R Vel (m/s)  Median Motor (Abd Poll Brev)  30C  Wrist *4.8   5.9   Elbow Wrist *44    Elbow 8.8   5.9         Ulnar Motor (Abd Dig Min)  30C  Wrist 3.2   8.6   B Elbow Wrist 55    B Elbow 6.3   8.1   A Elbow B Elbow 90    A Elbow 7.3   8.0            Waveforms:             Clinical History: CT ABDOMEN AND PELVIS WITHOUT CONTRAST  TECHNIQUE: Multidetector CT imaging of the abdomen and pelvis was performed following the standard protocol without IV contrast.  COMPARISON: Lumbar spine radiographs 03/21/2018.  FINDINGS: Lower chest: Clear lung bases. No significant pleural or  pericardial effusion.  Hepatobiliary: As imaged in the noncontrast state, the liver appears unremarkable.  No evidence of gallstones, gallbladder wall thickening or biliary dilatation.  Pancreas: Unremarkable. No pancreatic ductal dilatation or surrounding inflammatory changes.  Spleen: Normal in size without focal abnormality.  Adrenals/Urinary Tract: Both adrenal glands appear normal. There is a 17 mm cyst in the upper pole of the left kidney. Both kidneys otherwise appear normal, without evidence of urinary tract calculus or hydronephrosis. The bladder appears normal.  Stomach/Bowel: No evidence of bowel wall thickening, distention or surrounding inflammatory change. Diminutive appendix or appendiceal stump without surrounding inflammation. Moderate stool throughout the colon.  Vascular/Lymphatic: There are no enlarged abdominal or pelvic lymph nodes. Mild aortic and branch vessel atherosclerosis.  Reproductive: The uterus and ovaries appear normal. No adnexal mass.  Other: No evidence of abdominal wall mass or hernia. No ascites.  Musculoskeletal: No acute or significant osseous findings. Lower lumbar spondylosis with advanced disc space narrowing at L5-S1. S1 transitional. There is a moderate-sized disc extrusion at L2-3 with cephalad extension in the right subarticular lateral recess.  IMPRESSION: 1. No evidence of urinary tract calculus or hydronephrosis. 2. Right-sided disc extrusion at L2-3 may contribute to right-sided back pain. 3. No acute abdominal findings. 4. Aortic Atherosclerosis (ICD10-I70.0).   Electronically Signed  By: Carey Bullocks M.D.  On: 04/05/2018 12:25   She reports that she has quit smoking. She has never used smokeless tobacco. No results for input(s): HGBA1C, LABURIC in the last 8760 hours.  Objective:  VS:  HT:    WT:   BMI:     BP:   HR: bpm  TEMP: ( )  RESP:  Physical Exam Musculoskeletal:        General: No swelling,  tenderness or deformity.     Comments: Inspection reveals no atrophy of the bilateral APB or FDI or hand intrinsics. There is no swelling, color changes, allodynia or dystrophic changes. There is 5 out of 5 strength in the bilateral wrist extension, finger abduction and long finger flexion. There is intact sensation to light touch in all dermatomal and peripheral nerve distributions.  There is a negative Hoffmann's test bilaterally.  Skin:    General: Skin is warm and dry.     Findings: No erythema or rash.  Neurological:     General: No focal deficit present.     Mental Status: She is alert and oriented to person, place, and time.     Motor: No weakness or abnormal muscle tone.     Coordination: Coordination normal.  Psychiatric:        Mood and Affect: Mood normal.        Behavior: Behavior normal.     Ortho Exam Imaging: No results found.  Past Medical/Family/Surgical/Social History: Medications & Allergies reviewed per EMR, new medications updated. Patient Active Problem List   Diagnosis Date Noted  . Left carpal tunnel syndrome 04/24/2019  . Anxiety 12/27/2017  . Glaucoma suspect of both eyes 11/07/2017  . Hyperopia with astigmatism and presbyopia, bilateral 11/07/2017  . Vitreous floater, bilateral 11/07/2017  . Chronic diarrhea 03/23/2017  . History of colon polyps 03/23/2017  . Incontinence of feces 03/23/2017  . Loss of appetite 03/23/2017  . Weight loss 03/23/2017  . Presbyopia 02/12/2016  . 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 Recurrent major depressive disorder, in full remission (HCC) 12/18/2015  . 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 IBS (irritable bowel syndrome) 04/28/2015  . Insomnia 04/28/2015   History reviewed. No pertinent past medical history. History reviewed. No pertinent family history.  History reviewed. No pertinent surgical history. Social History   Occupational History  . Not on file  Tobacco Use   . Smoking status: Former Games developer  . Smokeless tobacco: Never Used  Substance and Sexual Activity  . Alcohol use: Not on file  . Drug use: Not on file  . Sexual activity: Not on file

## 2019-05-29 NOTE — Telephone Encounter (Signed)
I called patient and scheduled surgery. 

## 2019-06-07 ENCOUNTER — Other Ambulatory Visit: Payer: Self-pay | Admitting: Physician Assistant

## 2019-06-07 DIAGNOSIS — G5602 Carpal tunnel syndrome, left upper limb: Secondary | ICD-10-CM

## 2019-06-07 MED ORDER — HYDROCODONE-ACETAMINOPHEN 5-325 MG PO TABS: 1.0000 | ORAL_TABLET | Freq: Three times a day (TID) | ORAL | 0 refills | Status: DC | PRN

## 2019-06-07 MED ORDER — ONDANSETRON HCL 4 MG PO TABS: 4.0000 mg | ORAL_TABLET | Freq: Three times a day (TID) | ORAL | 0 refills | Status: AC | PRN

## 2019-06-14 ENCOUNTER — Encounter: Payer: Self-pay | Admitting: Physician Assistant

## 2019-06-14 ENCOUNTER — Other Ambulatory Visit: Payer: Self-pay

## 2019-06-14 ENCOUNTER — Ambulatory Visit (INDEPENDENT_AMBULATORY_CARE_PROVIDER_SITE_OTHER): Payer: Medicare HMO | Admitting: Physician Assistant

## 2019-06-14 DIAGNOSIS — Z9889 Other specified postprocedural states: Secondary | ICD-10-CM

## 2019-06-14 NOTE — Progress Notes (Signed)
   Post-Op Visit Note   Patient: Jennifer Navarro           Date of Birth: 05/19/1940           MRN: [id number] Visit Date: 06/14/2019 PCP: Lynn Ito, MD   Assessment & Plan:  Chief Complaint:  Chief Complaint  Patient presents with  . Post-op Follow-up   Visit Diagnoses:  1. S/P carpal tunnel release     Plan: Patient is a pleasant 79 year old female who comes in today 1 week status post left carpal tunnel release 06/07/2019.  She has been doing well.  She still has some soreness to the incision.  Slight numbness to the thumb, index, long and ring fingers although this has improved compared to preoperatively.  No fevers or chills.  Examination of her left wrist reveals a well-healing surgical incision with nylon sutures in place.  She is neurovascularly intact distally.  At this point, we will cover the wound with a Band-Aid and apply removable Velcro wrist splint.  No heavy lifting or submerging her hand in water for a total of 4 weeks postop.  Follow-up with [place] next week for suture removal.  Follow-Up Instructions: Return in about 1 week (around 06/21/2019).   Orders:  No orders of the defined types were placed in this encounter.  No orders of the defined types were placed in this encounter.   Imaging: No new imaging  PMFS History: Patient Active Problem List   Diagnosis Date Noted  . Left carpal tunnel syndrome 04/24/2019  . Anxiety 12/27/2017  . Glaucoma suspect of both eyes 11/07/2017  . Hyperopia with astigmatism and presbyopia, bilateral 11/07/2017  . Vitreous floater, bilateral 11/07/2017  . Chronic diarrhea 03/23/2017  . History of colon polyps 03/23/2017  . Incontinence of feces 03/23/2017  . Loss of appetite 03/23/2017  . Weight loss 03/23/2017  . Presbyopia 02/12/2016  . 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 Recurrent major depressive disorder, in full remission (HCC) 12/18/2015  . 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 IBS (irritable bowel syndrome) 04/28/2015  . Insomnia 04/28/2015   History reviewed. No pertinent past medical history.  History reviewed. No pertinent family history.  History reviewed. No pertinent surgical history. Social History   Occupational History  . Not on file  Tobacco Use  . Smoking status: Former Games developer  . Smokeless tobacco: Never Used  Substance and Sexual Activity  . Alcohol use: Not on file  . Drug use: Not on file  . Sexual activity: Not on file

## 2019-06-21 ENCOUNTER — Other Ambulatory Visit: Payer: Self-pay

## 2019-06-21 ENCOUNTER — Ambulatory Visit: Payer: Medicare HMO | Admitting: Physician Assistant

## 2019-06-21 ENCOUNTER — Encounter: Payer: Self-pay | Admitting: Orthopaedic Surgery

## 2019-06-21 VITALS — Ht 64.5 in | Wt 137.0 lb

## 2019-06-21 DIAGNOSIS — Z9889 Other specified postprocedural states: Secondary | ICD-10-CM

## 2019-06-21 MED ORDER — HYDROCODONE-ACETAMINOPHEN 5-325 MG PO TABS: 1.0000 | ORAL_TABLET | Freq: Two times a day (BID) | ORAL | 0 refills | Status: AC | PRN

## 2019-06-21 NOTE — Progress Notes (Signed)
   Post-Op Visit Note   Patient: Jennifer Navarro           Date of Birth: [birth year]           MRN: [id number] Visit Date: 06/21/2019 PCP: Lynn Ito, MD   Assessment & Plan:  Chief Complaint:  Chief Complaint  Patient presents with  . Left Hand - Follow-up    Left carpal tunnel release DOS 06/07/19   Visit Diagnoses:  1. S/P carpal tunnel release     Plan: Patient is a pleasant 79 year old female who comes in today 2 weeks out left carpal tunnel release 06/07/2019.  She had moderate to severe median nerve compression prior to surgical invention.  She has continued pain in the volar aspect of the wrist.  Worse with any motion of the wrist.  She has been wearing her removable splint at all times.  She does note that she ran out of Norco and has been taking only ibuprofen.  This does not seem to alleviate her symptoms.  No fevers or chills.  Examination of her left wrist reveals a well-healing surgical incision with nylon sutures in place.  No evidence of infection.  She does have moderate ecchymosis proximal to the knee incision.  This is moderately tender.  Very limited range of motion about the wrist secondary to pain and stiffness.  She does have decreased sensation to the thumb, index and long fingers.  Today, nylon sutures were removed and Steri-Strips applied.  She can wear her removable splint for comfort. We will start  Her in hand therapy for gentle range of motion.  No submerging her hand in water or heavy lifting for another 2 weeks.  I have refilled her Norco.  Follow-up with [place] in 4 weeks time for recheck.  Call with concerns or questions in the meantime.  Follow-Up Instructions: Return in about 4 weeks (around 07/19/2019).   Orders:  No orders of the defined types were placed in this encounter.  Meds ordered this encounter  Medications  . HYDROcodone-acetaminophen (NORCO) 5-325 MG tablet    Sig: Take 1-2 tablets by mouth 2 (two) times daily as needed for moderate pain.   Dispense:  20 tablet    Refill:  0    Imaging:   PMFS History: Patient Active Problem List   Diagnosis Date Noted  . Left carpal tunnel syndrome 04/24/2019  . Anxiety 12/27/2017  . Glaucoma suspect of both eyes 11/07/2017  . Hyperopia with astigmatism and presbyopia, bilateral 11/07/2017  . Vitreous floater, bilateral 11/07/2017  . Chronic diarrhea 03/23/2017  . History of colon polyps 03/23/2017  . Incontinence of feces 03/23/2017  . Loss of appetite 03/23/2017  . Weight loss 03/23/2017  . Presbyopia 02/12/2016  . 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 Recurrent major depressive disorder, in full remission (HCC) 12/18/2015  . 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 IBS (irritable bowel syndrome) 04/28/2015  . Insomnia 04/28/2015   History reviewed. No pertinent past medical history.  History reviewed. No pertinent family history.  History reviewed. No pertinent surgical history. Social History   Occupational History  . Not on file  Tobacco Use  . Smoking status: Former Games developer  . Smokeless tobacco: Never Used  Substance and Sexual Activity  . Alcohol use: Not on file  . Drug use: Not on file  . Sexual activity: Not on file

## 2019-07-03 ENCOUNTER — Other Ambulatory Visit: Payer: Self-pay

## 2019-07-03 ENCOUNTER — Ambulatory Visit: Payer: Medicare HMO | Attending: Physician Assistant | Admitting: Physical Therapy

## 2019-07-03 ENCOUNTER — Encounter: Payer: Self-pay | Admitting: Physical Therapy

## 2019-07-03 DIAGNOSIS — M6281 Muscle weakness (generalized): Secondary | ICD-10-CM | POA: Insufficient documentation

## 2019-07-03 DIAGNOSIS — M25532 Pain in left wrist: Secondary | ICD-10-CM | POA: Diagnosis not present

## 2019-07-03 DIAGNOSIS — M25632 Stiffness of left wrist, not elsewhere classified: Secondary | ICD-10-CM | POA: Diagnosis present

## 2019-07-03 NOTE — Therapy (Signed)
[facility] [address]  [place], [place], [address] Phone: [phone]   Fax:  [phone]  Physical Therapy Evaluation  Patient Details  Name: Jennifer Navarro MRN: [id number] Date of Birth: [birth year] Referring Provider (PT): Dwana Melena, [place]   Encounter Date: 07/03/2019  PT End of Session - 07/03/19 1003    Visit Number  1    Number of Visits  7    Date for PT Re-Evaluation  08/14/19    Authorization Type  A[employer]na Medicare    PT Start Time  [phone]    PT Stop Time  0957    PT Time Calculation (min)  39 min    Activity Tolerance  Patient tolerated treatment well;Patient limited by pain    Behavior During Therapy  [facility] for tasks assessed/performed       History reviewed. No pertinent past medical history.  History reviewed. No pertinent surgical history.  There were no vitals filed for this visit.   Subjective Assessment - 07/03/19 0920    Subjective  Patient reports undergoing L carpal tunnel release on 06/07/19. Was wearing a splint for a couple weeks, now out of it completely. Incision is nearly healed. Pain levels are lower in the AM, with about 5-6/10 in the PM. Still avoiding most activities with L hand such as lifting. Not using ice. Pain is located over palmar surface of L wrist, wrapping around to dorsal surface as well as incision site. Having nimbleness in 3rd digit. Worse with finger opposition and wrist flexion. Inciting injury occurred after a fall when playing tennis. Notes that she intermittently feels unsteady on her feet.    Pertinent History  anxiety, L CTS, depression    Diagnostic tests  none recent    Patient Stated Goals  "be able to use my L hand and not have pain with it"    Currently in Pain?  Yes    Pain Score  3     Pain Location  Wrist    Pain Orientation  Left    Pain Descriptors / Indicators  Aching    Pain Type  Acute pain;Surgical pain         [facility] PT Assessment - 07/03/19  0926      Assessment   Medical Diagnosis  s/p Carpal Tunnel Release    Referring Provider (PT)  Dwana Melena, PA-C    Onset Date/Surgical Date  06/07/19    Hand Dominance  Right    Next MD Visit  not scheduled    Prior Therapy  no      Precautions   Precautions  None      Balance Screen   Has the patient fallen in the past 6 months  Yes    How many times?  2    Has the patient had a decrease in activity level because of a fear of falling?   Yes    Is the patient reluctant to leave their home because of a fear of falling?   No      Home Film/video editor residence    Living Arrangements  Spouse/significant other    Available Help at Discharge  Family    Type of [place]      Prior Function   Level of [place]  Retired    Leisure  golf      Cognition   Overall Cognitive Status  Within  Functional Limits for tasks assessed      Observation/Other Assessments   Observations  well-healing incision without stitches      Sensation   Light Touch  Appears Intact      Coordination   Gross Motor Movements are Fluid and Coordinated  Yes      Posture/Postural Control   Posture/Postural Control  Postural limitations    Postural Limitations  Rounded Shoulders;Forward head;Increased thoracic kyphosis      ROM / Strength   AROM / PROM / Strength  AROM;Strength      AROM   AROM Assessment Site  Wrist    Right/Left Wrist  Left    Right Wrist Extension  73 Degrees    Right Wrist Flexion  62 Degrees    Right Wrist Radial Deviation  21 Degrees    Right Wrist Ulnar Deviation  40 Degrees    Left Wrist Extension  30 Degrees    Left Wrist Flexion  25 Degrees    Left Wrist Radial Deviation  8 Degrees    Left Wrist Ulnar Deviation  21 Degrees      Strength   Strength Assessment Site  Elbow;Forearm;Wrist;Hand    Right/Left Elbow  Right;Left    Right Elbow Flexion  4+/5    Right Elbow Extension  4+/5    Left Elbow Flexion   4+/5    Left Elbow Extension  4+/5    Right/Left Forearm  Right    Right Forearm Pronation  4+/5    Right Forearm Supination  4+/5    Right/Left Wrist  Right    Right Wrist Flexion  4+/5    Right Wrist Extension  4+/5    Right Wrist Radial Deviation  4+/5    Right Wrist Ulnar Deviation  4+/5    Right/Left hand  Right;Left    Right Hand Grip (lbs)  21.67   26, 20, 19; w/ arm unsupported   Right Hand Lateral Pinch  11 lbs   11, 11, 11   Right Hand 3 Point Pinch  12 lbs   12, 12, 12   Left Hand Gross Grasp  Impaired    Left Hand Grip (lbs)  0   0, 0, 0   Left Hand Lateral Pinch  6.67 lbs   6, 7, 7   Left Hand 3 Point Pinch  3 lbs   3, 3, 3     Palpation   Palpation comment  mild TTP over L thenar eminence                Objective measurements completed on examination: See above findings.              PT Education - 07/03/19 1003    Education Details  prognosis, POC, HEP    Person(s) Educated  Patient    Methods  Explanation;Demonstration;Tactile cues;Verbal cues;Handout    Comprehension  Verbalized understanding;Returned demonstration       PT Short Term Goals - 07/03/19 1011      PT SHORT TERM GOAL #1   Title  Patient to be independent with initial HEP.    Time  3    Period  Weeks    Status  New    Target Date  07/24/19        PT Long Term Goals - 07/03/19 1011      PT LONG TERM GOAL #1   Title  Patient to be independent with advanced HEP.    Time  6  Period  Weeks    Status  New    Target Date  08/14/19      PT LONG TERM GOAL #2   Title  Patient to demonstrate L wrist AROM WFL and without pain limiting.    Time  6    Period  Weeks    Status  New    Target Date  08/14/19      PT LONG TERM GOAL #3   Title  Patient to demonstrate L full handed grip, lateral pinch grip, and 3 point pinch grip 90% of R UE as well as L wrist strength 4+/5.    Time  6    Period  Weeks    Status  New    Target Date  08/14/19      PT LONG TERM  GOAL #4   Title  Patient to report tolerance of putting plates away in overhead cabinet with L UE without pain.    Time  6    Period  Weeks    Status  New    Target Date  08/14/19      PT LONG TERM GOAL #5   Title  Patient to report 85% improvement in pain levels.    Time  6    Period  Weeks    Status  New    Target Date  08/14/19             Plan - 07/03/19 1003    Clinical Impression Statement  Patient is a 78y/o F presenting to [facility] with c/o L wrist pain s/p L carpal tunnel release on 06/07/19. Patient now out of sling. Notes pain is located over palmar surface of L wrist, wrapping around to dorsal surface, as well as over incision site. Notes remaining numbness in L 3rd digit. Pain worse towards the end of the day, with finger opposition, and wrist flexion. Patient today presenting with limited L wrist AROM, TTP over L thenar eminence, decreased L full handed grip, lateral pinch grip, and 3 point pinch grip strength. Patient educated on gentle L wrist PROM HEP- patient reported understanding. Also educated patient on availability of balance training if she would like to pursue this to avoid future falls. Would benefit from skilled PT services 1x/week for 6 weeks to address aforementioned impairments.    Personal Factors and Comorbidities  Age;Comorbidity 2;Fitness;Past/Current Experience;Time since onset of injury/illness/exacerbation    Comorbidities  anxiety, L CTS, depression    Examination-Activity Limitations  Caring for Others;Carry;Hygiene/Grooming;Dressing;Lift    Examination-Participation Restrictions  Cleaning;Shop;Community Activity;Driving;Yard Work;Interpersonal Relationship;Laundry;Meal Prep    Stability/Clinical Decision Making  Stable/Uncomplicated    Clinical Decision Making  Low    Rehab Potential  Good    PT Frequency  1x / week    PT Duration  6 weeks    PT Treatment/Interventions  ADLs/Self Care Home Management;Cryotherapy;Electrical Stimulation;Iontophoresis  4mg /ml Dexamethasone;Moist Heat;Therapeutic exercise;Therapeutic activities;Functional mobility training;Ultrasound;Neuromuscular re-education;Patient/family education;Manual techniques;Taping;Splinting;Energy conservation;Dry needling;Passive range of motion;Scar mobilization    PT Next Visit Plan  reassess HEP; wrist FOTO    Consulted and Agree with Plan of Care  Patient       Patient will benefit from skilled therapeutic intervention in order to improve the following deficits and impairments:  Hypomobility, Increased edema, Decreased scar mobility, Decreased activity tolerance, Decreased strength, Pain, Impaired UE functional use, Decreased balance, Improper body mechanics, Decreased range of motion, Impaired flexibility, Postural dysfunction  Visit Diagnosis: Pain in left wrist  Stiffness of left wrist, not elsewhere classified  Muscle  weakness (generalized)     Problem List Patient Active Problem List   Diagnosis Date Noted  . Left carpal tunnel syndrome 04/24/2019  . Anxiety 12/27/2017  . Glaucoma suspect of both eyes 11/07/2017  . Hyperopia with astigmatism and presbyopia, bilateral 11/07/2017  . Vitreous floater, bilateral 11/07/2017  . Chronic diarrhea 03/23/2017  . History of colon polyps 03/23/2017  . Incontinence of feces 03/23/2017  . Loss of appetite 03/23/2017  . Weight loss 03/23/2017  . Presbyopia 02/12/2016  . 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 Recurrent major depressive disorder, in full remission (HCC) 12/18/2015  . 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 IBS (irritable bowel syndrome) 04/28/2015  . Insomnia 04/28/2015     Anette Guarneri, PT, DPT 07/03/19 12:25 PM   [facility] [address]  [address] [place], [place], [address] Phone: [phone]   Fax:  [phone]  Name: Clarrissa Shimkus MRN: [id number] Date of Birth: 1940/11/18

## 2019-07-10 ENCOUNTER — Other Ambulatory Visit: Payer: Self-pay

## 2019-07-10 ENCOUNTER — Ambulatory Visit: Payer: Medicare HMO | Attending: Physician Assistant

## 2019-07-10 DIAGNOSIS — M6281 Muscle weakness (generalized): Secondary | ICD-10-CM

## 2019-07-10 DIAGNOSIS — M25632 Stiffness of left wrist, not elsewhere classified: Secondary | ICD-10-CM | POA: Diagnosis present

## 2019-07-10 DIAGNOSIS — M25532 Pain in left wrist: Secondary | ICD-10-CM

## 2019-07-10 NOTE — Therapy (Signed)
[facility] [address]  [address] [place], [place], [address] Phone: [phone]   Fax:  [phone]  Physical Therapy Treatment  Patient Details  Name: Jennifer Navarro MRN: [id number] Date of Birth: [birth year] Referring Provider (PT): Jari Sportsman, [place]   Encounter Date: 07/10/2019  PT End of Session - 07/10/19 1113    Visit Number  2    Number of Visits  7    Date for PT Re-Evaluation  08/14/19    Authorization Type  Aetna Medicare    PT Start Time  1106    PT Stop Time  1147    PT Time Calculation (min)  41 min    Activity Tolerance  Patient tolerated treatment well;Patient limited by pain    Behavior During Therapy  [facility] for tasks assessed/performed       No past medical history on file.  No past surgical history on file.  There were no vitals filed for this visit.  Subjective Assessment - 07/10/19 1112    Subjective  Pt. reporting no issues with HEP.    Pertinent History  anxiety, L CTS, depression    Diagnostic tests  none recent    Patient Stated Goals  "be able to use my L hand and not have pain with it"    Currently in Pain?  Yes    Pain Score  4     Pain Location  Wrist    Pain Orientation  Left    Pain Descriptors / Indicators  Aching    Pain Type  Acute pain;Surgical pain    Pain Radiating Towards  Pain radiating into L middle finger    Pain Onset  More than a month ago    Pain Frequency  Constant    Pain Relieving Factors  sleeping with robe under hand    Multiple Pain Sites  No         [facility] PT Assessment - 07/10/19 0001      Observation/Other Assessments   Focus on Therapeutic Outcomes (FOTO)   Wrist: 35% status (65% limitation)                   [facility] Adult PT Treatment/Exercise - 07/10/19 0001      Hand Exercises   Fine Motor Coordination  Digit   thumb touch to each finger x 5 rounds    Other Hand Exercises  L grip with towel 5" x 10 reps     Other Hand Exercises  L finger  flexion/extension 3" x 10 reps       Wrist Exercises   Wrist Flexion  Left;AROM;10 reps;Seated    Wrist Flexion Limitations  cues for increased ROM     Wrist Extension  Left;AROM;10 reps;Seated    Wrist Extension Limitations  cues for increased ROM     Wrist Radial Deviation  Left;10 reps;AROM;Seated    Wrist Radial Deviation Limitations  AROM    Wrist Ulnar Deviation  Left;10 reps;AROM;Seated    Wrist Ulnar Deviation Limitations  AROM     Other wrist exercises  L wrist extension, flexion stretch 2 x 30 sec     Other wrist exercises  L wrist ulnar and radial deviation stretch x 30 sec each way                PT Short Term Goals - 07/10/19 1113      PT SHORT TERM GOAL #1   Title  Patient to be independent  with initial HEP.    Time  3    Period  Weeks    Status  On-going    Target Date  07/24/19        PT Long Term Goals - 07/10/19 1114      PT LONG TERM GOAL #1   Title  Patient to be independent with advanced HEP.    Time  6    Period  Weeks    Status  On-going      PT LONG TERM GOAL #2   Title  Patient to demonstrate L wrist AROM WFL and without pain limiting.    Time  6    Period  Weeks    Status  On-going      PT LONG TERM GOAL #3   Title  Patient to demonstrate L full handed grip, lateral pinch grip, and 3 point pinch grip 90% of R UE as well as L wrist strength 4+/5.    Time  6    Period  Weeks    Status  On-going      PT LONG TERM GOAL #4   Title  Patient to report tolerance of putting plates away in overhead cabinet with L UE without pain.    Time  6    Period  Weeks    Status  On-going      PT LONG TERM GOAL #5   Title  Patient to report 85% improvement in pain levels.    Time  6    Period  Weeks    Status  On-going            Plan - 07/10/19 1116    Clinical Impression Statement  Pt. doing well today and notes not issue with HEP.  Tolerated HEP review/demo well today only with min cues required to push for increased ROM with  stretches.  Tolerated addition of L wrist AROM activities well today and ended session with pain returning to baseline.  Will consider HEP update at upcoming visit per pt. response.    Comorbidities  anxiety, L CTS, depression    Rehab Potential  Good    PT Treatment/Interventions  ADLs/Self Care Home Management;Cryotherapy;Electrical Stimulation;Iontophoresis 4mg /ml Dexamethasone;Moist Heat;Therapeutic exercise;Therapeutic activities;Functional mobility training;Ultrasound;Neuromuscular re-education;Patient/family education;Manual techniques;Taping;Splinting;Energy conservation;Dry needling;Passive range of motion;Scar mobilization    Consulted and Agree with Plan of Care  Patient       Patient will benefit from skilled therapeutic intervention in order to improve the following deficits and impairments:  Hypomobility, Increased edema, Decreased scar mobility, Decreased activity tolerance, Decreased strength, Pain, Impaired UE functional use, Decreased balance, Improper body mechanics, Decreased range of motion, Impaired flexibility, Postural dysfunction  Visit Diagnosis: Pain in left wrist  Stiffness of left wrist, not elsewhere classified  Muscle weakness (generalized)     Problem List Patient Active Problem List   Diagnosis Date Noted  . Left carpal tunnel syndrome 04/24/2019  . Anxiety 12/27/2017  . Glaucoma suspect of both eyes 11/07/2017  . Hyperopia with astigmatism and presbyopia, bilateral 11/07/2017  . Vitreous floater, bilateral 11/07/2017  . Chronic diarrhea 03/23/2017  . History of colon polyps 03/23/2017  . Incontinence of feces 03/23/2017  . Loss of appetite 03/23/2017  . Weight loss 03/23/2017  . Presbyopia 02/12/2016  . 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 Recurrent major depressive disorder, in full remission (HCC) 12/18/2015  . 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 IBS (irritable bowel syndrome) 04/28/2015  .  Insomnia 04/28/2015  Bess Harvest, PTA 07/10/19 12:11 PM    [facility] [address]  [place], [place], [address] Phone: [phone]   Fax:  [phone]  Name: Jennifer Navarro MRN: [id number] Date of Birth: 14-Mar-1941

## 2019-07-17 ENCOUNTER — Ambulatory Visit: Payer: Medicare HMO | Admitting: Physical Therapy

## 2019-07-17 ENCOUNTER — Other Ambulatory Visit: Payer: Self-pay

## 2019-07-17 ENCOUNTER — Encounter: Payer: Self-pay | Admitting: Physical Therapy

## 2019-07-17 DIAGNOSIS — M25532 Pain in left wrist: Secondary | ICD-10-CM

## 2019-07-17 DIAGNOSIS — M6281 Muscle weakness (generalized): Secondary | ICD-10-CM

## 2019-07-17 DIAGNOSIS — M25632 Stiffness of left wrist, not elsewhere classified: Secondary | ICD-10-CM

## 2019-07-17 NOTE — Therapy (Addendum)
[facility] [address]  [place], [place], [address] Phone: [phone]   Fax:  [phone]  Physical Therapy Treatment  Patient Details  Name: Jennifer Navarro MRN: [id number] Date of Birth: 04-Feb-1941 Referring Provider (PT): Dwana Melena, PA-C    Progress Note Reporting Period 07/03/19 to 07/17/19  See note below for Objective Data and Assessment of Progress/Goals.     Encounter Date: 07/17/2019  PT End of Session - 07/17/19 1149    Visit Number  3    Number of Visits  7    Date for PT Re-Evaluation  08/14/19    Authorization Type  Aetna Medicare    PT Start Time  1103    PT Stop Time  1157   moist heat   PT Time Calculation (min)  54 min    Activity Tolerance  Patient tolerated treatment well;Patient limited by pain    Behavior During Therapy  [facility] for tasks assessed/performed       History reviewed. No pertinent past medical history.  History reviewed. No pertinent surgical history.  There were no vitals filed for this visit.  Subjective Assessment - 07/17/19 1104    Subjective  Having dorsal L wrist pain which is better in the AM, worse in the PM, Pointing to a bump over the back of her wrist.    Pertinent History  anxiety, L CTS, depression    Diagnostic tests  none recent    Patient Stated Goals  "be able to use my L hand and not have pain with it"    Currently in Pain?  Yes    Pain Score  4     Pain Location  Wrist    Pain Orientation  Left;Anterior;Posterior    Pain Descriptors / Indicators  Aching    Pain Type  Acute pain;Surgical pain                       [facility] Adult PT Treatment/Exercise - 07/17/19 0001      Hand Exercises   DIPJ Flexion  AROM;Left;10 reps    DIPJ Flexion Limitations  each finger with PIP blocked    DIPJ Extension  Strengthening;Left;10 reps    DIPJ Extension Limitations  each finger with PIP blocked    Thumb Opposition  composite L thumb  flexion/extension x10   cues to increase extension ROM   Other Hand Exercises  L hand composite finger flexion x10 with yellow putty, tip pinch with each finger x5 each;     Other Hand Exercises  L composite PIP flexion x10,    difficulty isolating DIP     Modalities   Modalities  Moist Heat      Moist Heat Therapy   Number Minutes Moist Heat  10 Minutes    Moist Heat Location  Wrist   L     Manual Therapy   Manual Therapy  Passive ROM;Joint mobilization;Soft tissue mobilization;Myofascial release    Manual therapy comments  sitting    Joint Mobilization  L wrist carpal mobilizations A/P and P/A grade III to tolerance    Soft tissue mobilization  STM to L brachioradialis and wrist flexors, wrist extensors- tender trigger pts in brachioradialis; scar mobilization to L wrist     Myofascial Release  manual TPR to L brachioradialis- tenderness    Passive ROM  L wrist PROM in fleixon, extension, radial/ulnar deviaiton to tolerance  PT Education - 07/17/19 1149    Education Details  update to HEP; edu on scar massage and use of putty for light strengthening    Person(s) Educated  Patient    Methods  Explanation;Demonstration;Tactile cues;Verbal cues;Handout    Comprehension  Verbalized understanding;Returned demonstration       PT Short Term Goals - 07/10/19 1113      PT SHORT TERM GOAL #1   Title  Patient to be independent with initial HEP.    Time  3    Period  Weeks    Status  On-going    Target Date  07/24/19        PT Long Term Goals - 07/10/19 1114      PT LONG TERM GOAL #1   Title  Patient to be independent with advanced HEP.    Time  6    Period  Weeks    Status  On-going      PT LONG TERM GOAL #2   Title  Patient to demonstrate L wrist AROM WFL and without pain limiting.    Time  6    Period  Weeks    Status  On-going      PT LONG TERM GOAL #3   Title  Patient to demonstrate L full handed grip, lateral pinch grip, and 3 point pinch grip  90% of R UE as well as L wrist strength 4+/5.    Time  6    Period  Weeks    Status  On-going      PT LONG TERM GOAL #4   Title  Patient to report tolerance of putting plates away in overhead cabinet with L UE without pain.    Time  6    Period  Weeks    Status  On-going      PT LONG TERM GOAL #5   Title  Patient to report 85% improvement in pain levels.    Time  6    Period  Weeks    Status  On-going            Plan - 07/17/19 1150    Clinical Impression Statement  Patient noting continued pain in L dorsal and volar wrist since surgery. Pointing to small protrusion over dorsal wrist as point of tenderness- upon palpation no edema or redness over the dorsal carpal bones. Worked on soft tissue mobilization to L wrist flexors and extensors and brachioradialis- patient demonstrating tender trigger points along mid-length of this muscle. Scar massage also provided to patient's healed scar, with palpable restriction most evident just lateral to incision. Patient reported relief with gentle carpal joint mobs. Tolerated L wrist PROM in all planes but with wrist extension most painful and limited. Initiated isolation of PIP and DIP flexion- patient demonstrated most difficulty isolating DIP flexion/extension, especially in digit 2. Initiated light strengthening with use of putty with good tolerance by patient. Updated HEP with exercises that were well-tolerated today. Patient reported understanding. Ended session with moist heat to L wrist. Patient reporting "this is the first time I've had some relief" at end of session.    Comorbidities  anxiety, L CTS, depression    Rehab Potential  Good    PT Treatment/Interventions  ADLs/Self Care Home Management;Cryotherapy;Electrical Stimulation;Iontophoresis 62m/ml Dexamethasone;Moist Heat;Therapeutic exercise;Therapeutic activities;Functional mobility training;Ultrasound;Neuromuscular re-education;Patient/family education;Manual  techniques;Taping;Splinting;Energy conservation;Dry needling;Passive range of motion;Scar mobilization    PT Next Visit Plan  progress wrist strengthening, PROM/AROM, scar massage/STM    Consulted and Agree with Plan of  Care  Patient       Patient will benefit from skilled therapeutic intervention in order to improve the following deficits and impairments:  Hypomobility, Increased edema, Decreased scar mobility, Decreased activity tolerance, Decreased strength, Pain, Impaired UE functional use, Decreased balance, Improper body mechanics, Decreased range of motion, Impaired flexibility, Postural dysfunction  Visit Diagnosis: Pain in left wrist  Stiffness of left wrist, not elsewhere classified  Muscle weakness (generalized)     Problem List Patient Active Problem List   Diagnosis Date Noted  . Left carpal tunnel syndrome 04/24/2019  . Anxiety 12/27/2017  . Glaucoma suspect of both eyes 11/07/2017  . Hyperopia with astigmatism and presbyopia, bilateral 11/07/2017  . Vitreous floater, bilateral 11/07/2017  . Chronic diarrhea 03/23/2017  . History of colon polyps 03/23/2017  . Incontinence of feces 03/23/2017  . Loss of appetite 03/23/2017  . Weight loss 03/23/2017  . Presbyopia 02/12/2016  . 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 Recurrent major depressive disorder, in full remission ([place]) 12/18/2015  . 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 IBS (irritable bowel syndrome) 04/28/2015  . Insomnia 04/28/2015     Janene Harvey, PT, DPT 07/17/19 12:01 PM   [facility] [address]  [place], [place], [address] Phone: [phone]   Fax:  [phone]  Name: Jennifer Navarro MRN: [id number] Date of Birth: 10-10-1940   PHYSICAL THERAPY DISCHARGE SUMMARY  Visits from Start of Care: 3  Current functional level related to goals / functional outcomes: Unable to assess;  patient did not return   Remaining deficits: Unable to assess   Education / Equipment: HEP  Plan: Patient agrees to discharge.  Patient goals were not met. Patient is being discharged due to not returning since the last visit.  ?????     Janene Harvey, PT, DPT 09/20/19 8:47 AM

## 2019-07-31 ENCOUNTER — Encounter: Payer: Medicare HMO | Admitting: Physical Therapy

## 2019-08-07 ENCOUNTER — Encounter: Payer: Medicare HMO | Admitting: Physical Therapy

## 2020-01-23 IMAGING — MR MRI LUMBAR SPINE WITHOUT CONTRAST
4 of 5 series · 26 of 48 positions shown · non-contrast
Comparison: None.

CLINICAL DATA: Low back pain radiating to the right thigh.

EXAM:
MRI LUMBAR SPINE WITHOUT CONTRAST
TECHNIQUE: Multiplanar, multisequence MR imaging of the lumbar spine was
performed. No intravenous contrast was administered.

[Series 3: T2 · sagittal · 4.0mm · 0.55mm/px · 6 of 14 slices shown (1 of 2)]
[im 1/14]
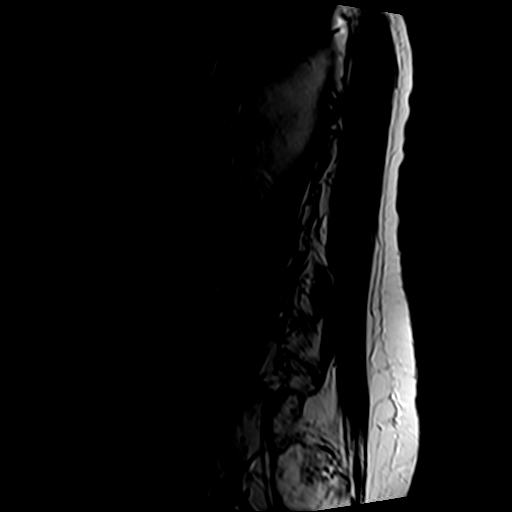
[im 3/14]
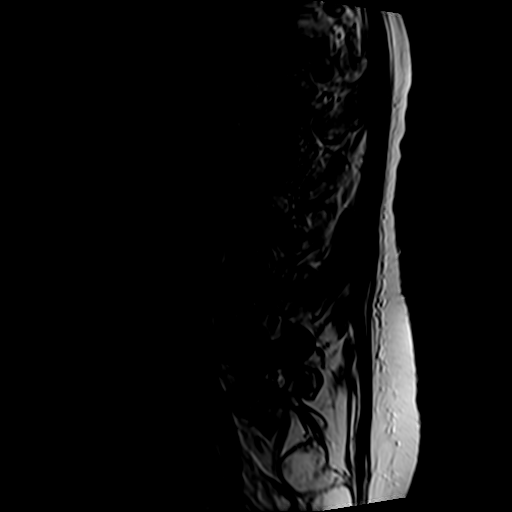
[im 6/14]
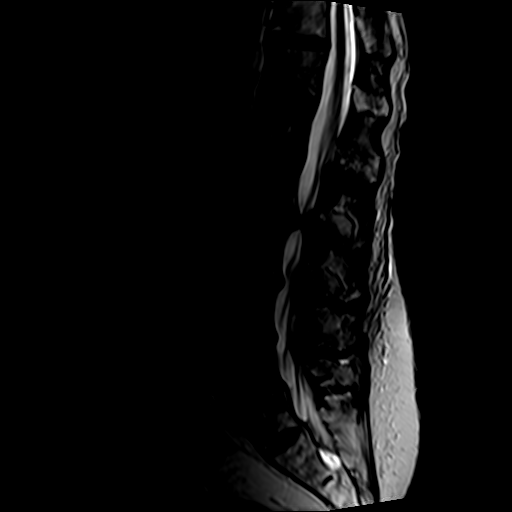
[im 8/14]
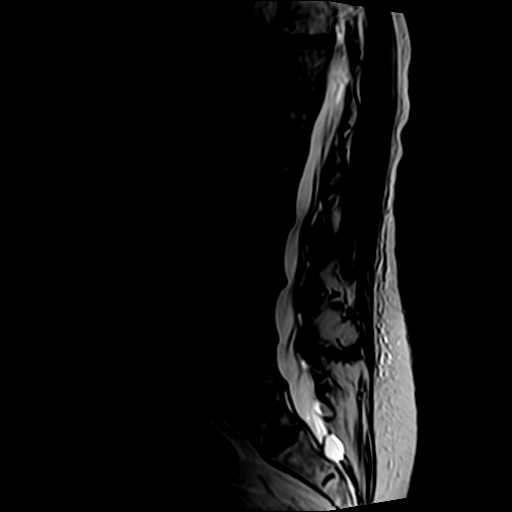
[im 11/14]
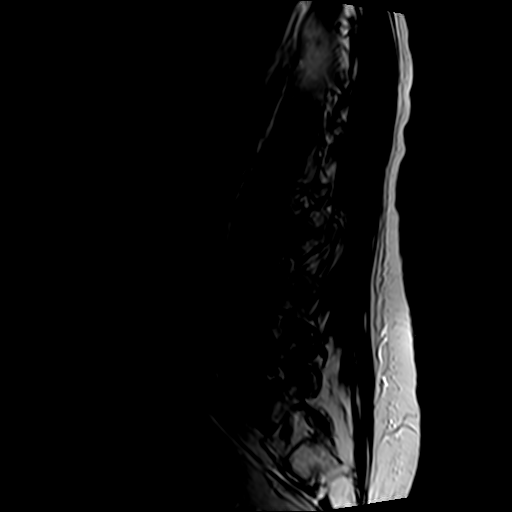
[im 14/14]
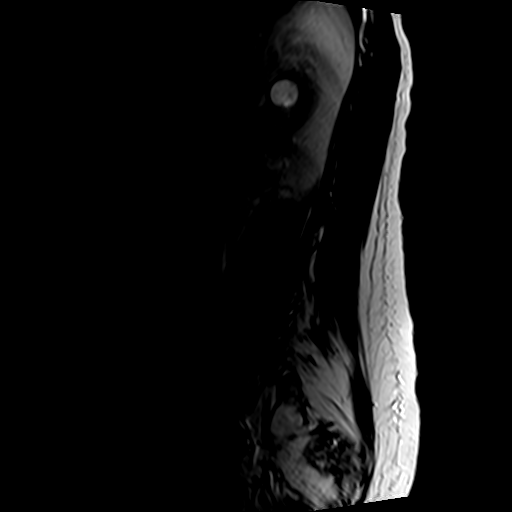

[Series 4: T1 · sagittal · 4.0mm · 0.55mm/px · 6 of 14 slices shown (1 of 2)]
[im 1/14]
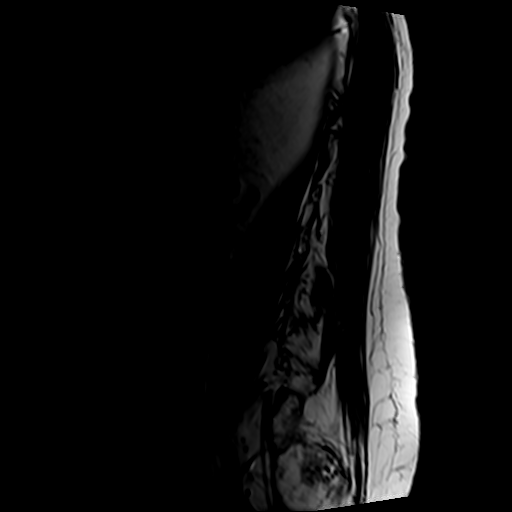
[im 3/14]
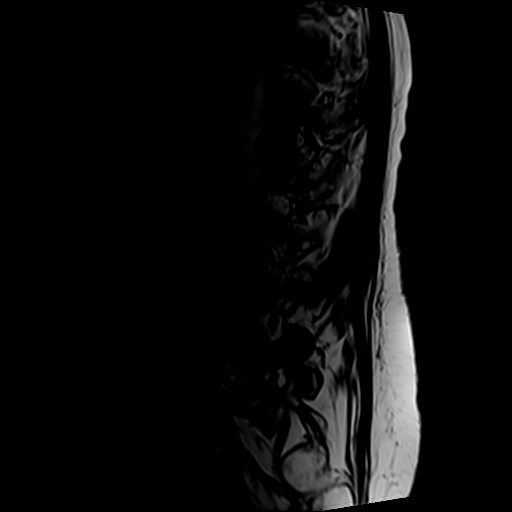
[im 6/14]
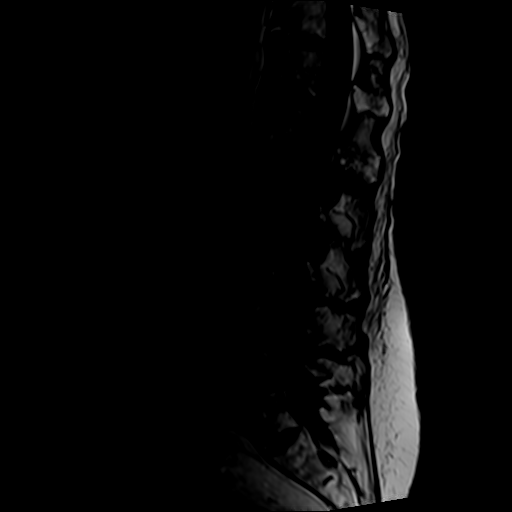
[im 8/14]
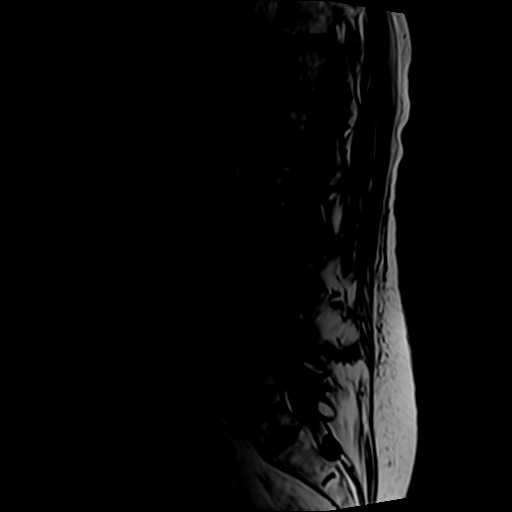
[im 11/14]
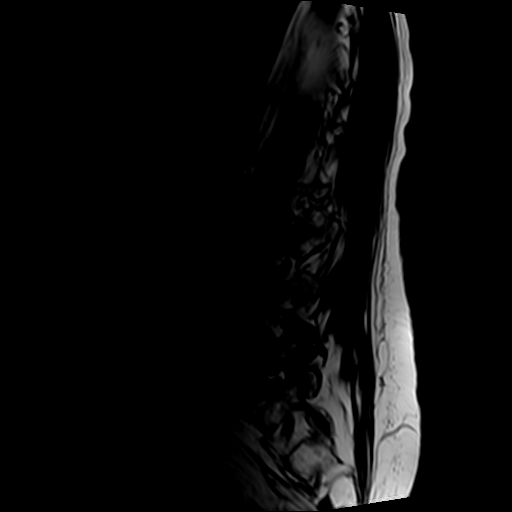
[im 14/14]
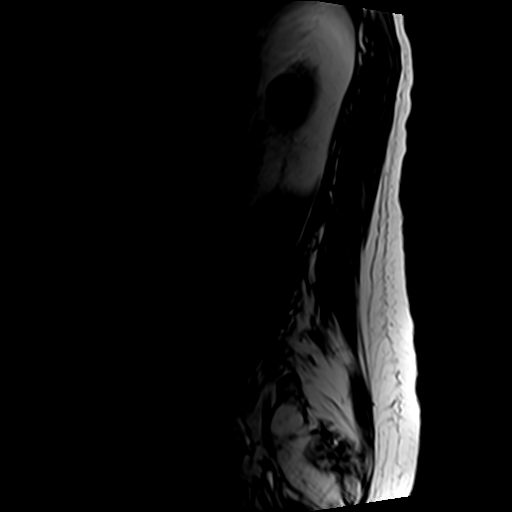

[Series 6: T2 · axial · 4.0mm · 0.70mm/px · z∈[-76,+119]mm · 9 of 37 slices shown (2 of 2)]
[im 1/37]
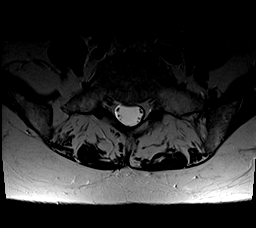
[im 6/37]
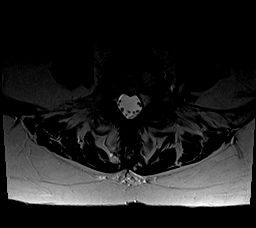
[im 11/37]
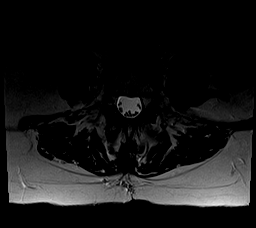
[im 16/37]
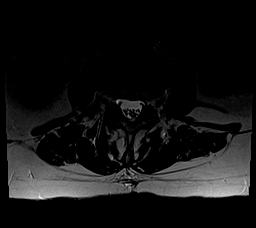
[im 19/37]
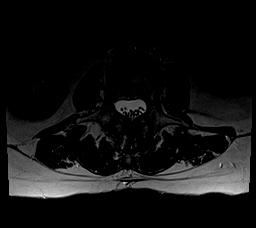
[im 21/37]
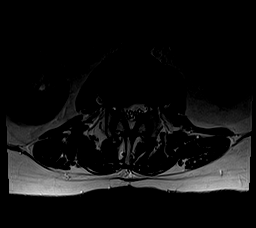
[im 26/37]
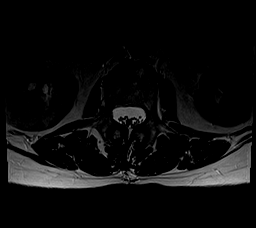
[im 31/37]
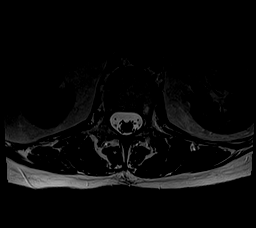
[im 37/37]
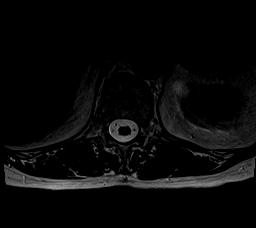

[Series 7: T1 · axial · 4.0mm · 0.35mm/px · z∈[-76,+89]mm · 5 of 37 slices shown (2 of 2)]
[im 1/37]
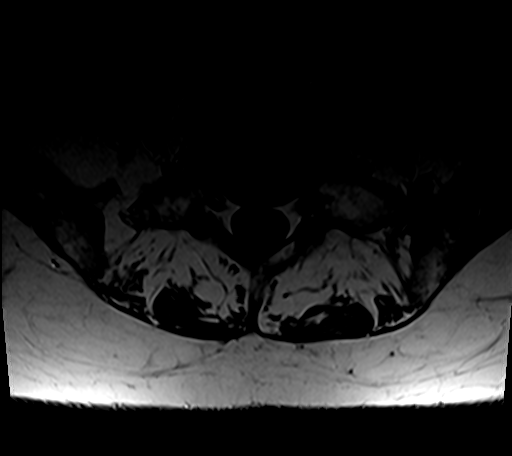
[im 6/37]
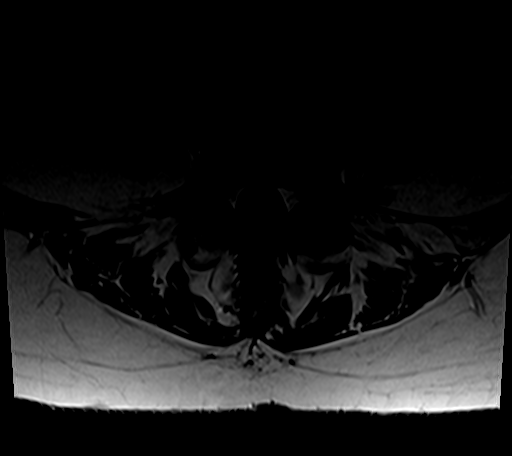
[im 11/37]
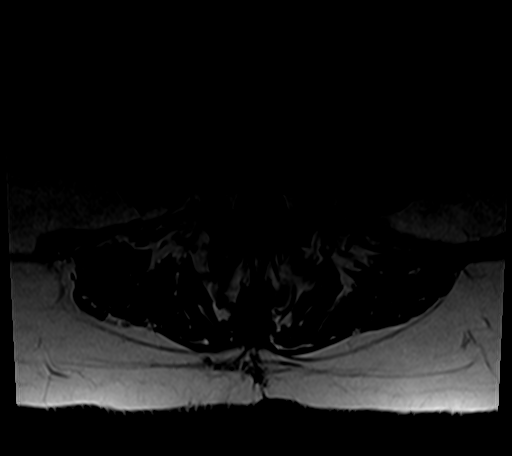
[im 19/37]
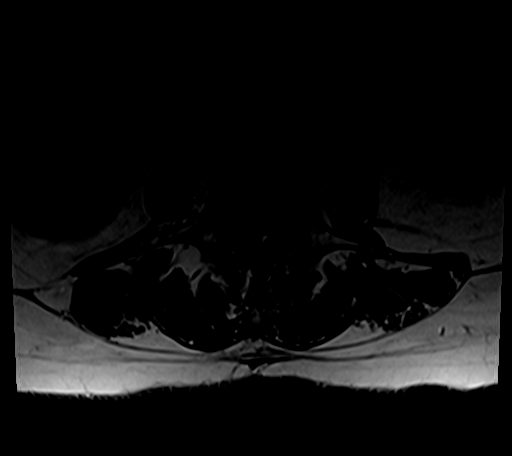
[im 31/37]
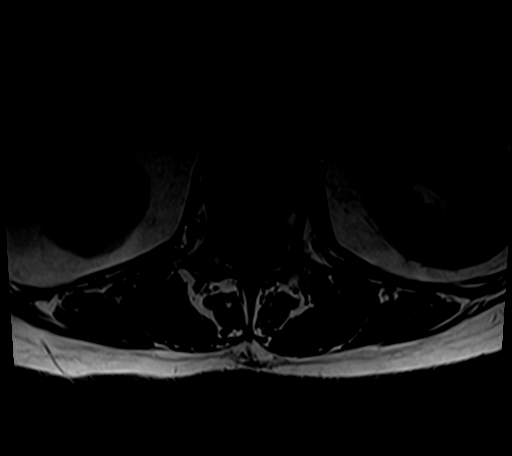

[26 of 48 positions shown; findings below may reference images not displayed]

FINDINGS: Segmentation: 5 lumbar type vertebrae. There is a rudimentary S1/2
disc space.

Alignment:  Borderline anterolisthesis at L2-3

Vertebrae:  No fracture, evidence of discitis, or bone lesion.

Conus medullaris and cauda equina: Conus extends to the L1-2 level.
Conus and cauda equina appear normal.

Paraspinal and other soft tissues: Negative

Disc levels:

T12- L1: Unremarkable.

L1-L2: Minor facet spurring.

L2-L3: Right paracentral herniation with L3 impingement. Mild facet
spurring. The foramina are patent

L3-L4: Mild disc narrowing and bulging. Mild posterior element
hypertrophy. No neural compression

L4-L5: Mild disc narrowing and bulging. Mild facet spurring. No
impingement

L5-S1:Advanced disc narrowing.  Mild facet spurring.  No impingement
IMPRESSION: 1. Symptomatic level is likely L2-3 where right paracentral
herniation compresses the L3 nerve root.
2. Noncompressive degenerative changes at the other levels as
described above.
